# Patient Record
Sex: Male | Born: 1965
Health system: Southern US, Community
[De-identification: ages and names within clinical notes are randomized; demographics above are authoritative.]

## PROBLEM LIST (undated history)

## (undated) DIAGNOSIS — M1990 Unspecified osteoarthritis, unspecified site: Secondary | ICD-10-CM

## (undated) DIAGNOSIS — F1290 Cannabis use, unspecified, uncomplicated: Secondary | ICD-10-CM

## (undated) DIAGNOSIS — K5900 Constipation, unspecified: Secondary | ICD-10-CM

## (undated) HISTORY — PX: HAND SURGERY: SHX662

## (undated) HISTORY — DX: Cannabis use, unspecified, uncomplicated: F12.90

## (undated) HISTORY — DX: Constipation, unspecified: K59.00

## (undated) HISTORY — PX: HEMORROIDECTOMY: SUR656

---

## 1998-05-19 ENCOUNTER — Emergency Department (HOSPITAL_COMMUNITY): Admission: EM | Admit: 1998-05-19 | Discharge: 1998-05-19 | Payer: Self-pay

## 2002-03-05 ENCOUNTER — Encounter: Payer: Self-pay | Admitting: Emergency Medicine

## 2002-03-05 ENCOUNTER — Emergency Department (HOSPITAL_COMMUNITY): Admission: EM | Admit: 2002-03-05 | Discharge: 2002-03-05 | Payer: Self-pay | Admitting: Emergency Medicine

## 2007-01-29 ENCOUNTER — Emergency Department (HOSPITAL_COMMUNITY): Admission: EM | Admit: 2007-01-29 | Discharge: 2007-01-29 | Payer: Self-pay | Admitting: Emergency Medicine

## 2007-02-15 ENCOUNTER — Emergency Department (HOSPITAL_COMMUNITY): Admission: EM | Admit: 2007-02-15 | Discharge: 2007-02-15 | Payer: Self-pay | Admitting: Emergency Medicine

## 2009-01-16 ENCOUNTER — Emergency Department (HOSPITAL_COMMUNITY): Admission: EM | Admit: 2009-01-16 | Discharge: 2009-01-16 | Payer: Self-pay | Admitting: Emergency Medicine

## 2009-01-23 ENCOUNTER — Observation Stay (HOSPITAL_COMMUNITY): Admission: EM | Admit: 2009-01-23 | Discharge: 2009-01-23 | Payer: Self-pay | Admitting: Emergency Medicine

## 2009-12-16 ENCOUNTER — Emergency Department (HOSPITAL_COMMUNITY): Admission: EM | Admit: 2009-12-16 | Discharge: 2009-12-16 | Payer: Self-pay | Admitting: Emergency Medicine

## 2010-07-05 LAB — URINALYSIS, ROUTINE W REFLEX MICROSCOPIC
Bilirubin Urine: NEGATIVE
Ketones, ur: NEGATIVE mg/dL
Nitrite: NEGATIVE
Protein, ur: NEGATIVE mg/dL
Specific Gravity, Urine: 1.006 (ref 1.005–1.030)
Urobilinogen, UA: 1 mg/dL (ref 0.0–1.0)
pH: 6 (ref 5.0–8.0)

## 2010-07-05 LAB — COMPREHENSIVE METABOLIC PANEL
ALT: 16 U/L (ref 0–53)
AST: 23 U/L (ref 0–37)
Alkaline Phosphatase: 54 U/L (ref 39–117)
BUN: 8 mg/dL (ref 6–23)
CO2: 23 mEq/L (ref 19–32)
Creatinine, Ser: 1.5 mg/dL (ref 0.4–1.5)
GFR calc Af Amer: >60 mL/min (ref >60)
Glucose, Bld: 124 mg/dL — ABNORMAL HIGH (ref 70–99)
Total Bilirubin: 0.8 mg/dL (ref 0.3–1.2)
Total Protein: 7.3 g/dL (ref 6.0–8.3)

## 2010-07-05 LAB — DIFFERENTIAL
Basophils Relative: 1 % (ref 0–1)
Lymphs Abs: 2 10*3/uL (ref 0.7–4.0)
Monocytes Absolute: 0.4 10*3/uL (ref 0.1–1.0)
Neutro Abs: 8.6 10*3/uL — ABNORMAL HIGH (ref 1.7–7.7)

## 2010-07-05 LAB — URINE MICROSCOPIC-ADD ON

## 2010-07-05 LAB — CBC: MCHC: 34.9 g/dL (ref 30.0–36.0)

## 2010-07-25 LAB — BASIC METABOLIC PANEL
Calcium: 9.6 mg/dL (ref 8.4–10.5)
GFR calc Af Amer: >60 mL/min (ref >60)
GFR calc non Af Amer: 52 mL/min — ABNORMAL LOW (ref >60)
Glucose, Bld: 100 mg/dL — ABNORMAL HIGH (ref 70–99)
Sodium: 139 mEq/L (ref 135–145)

## 2010-07-25 LAB — CBC
Hemoglobin: 15.8 g/dL (ref 13.0–17.0)
RBC: 5.01 MIL/uL (ref 4.22–5.81)
RDW: 14.6 % (ref 11.5–15.5)
WBC: 3.8 10*3/uL — ABNORMAL LOW (ref 4.0–10.5)

## 2010-07-25 LAB — DIFFERENTIAL
Basophils Relative: 1 % (ref 0–1)
Eosinophils Absolute: 0.2 10*3/uL (ref 0.0–0.7)
Lymphocytes Relative: 36 % (ref 12–46)
Monocytes Relative: 10 % (ref 3–12)
Neutro Abs: 1.8 10*3/uL (ref 1.7–7.7)

## 2010-09-06 NOTE — Consult Note (Signed)
NAME:  Gregory Ray, Gregory Ray NO.:  [id number]   MEDICAL RECORD NO.:  [id number]                   PATIENT TYPE:  EMS   LOCATION:  ED                                   FACILITY:  [facility]   PHYSICIAN:  Dionne Ano. Everlene Other, M.D.         DATE OF BIRTH:  Sep 20, 1965   DATE OF CONSULTATION:  DATE OF DISCHARGE:                                   CONSULTATION   HISTORY:  In the emergency room, upon consultation with Dr. Carren Rang,  in regards to the patient's left ring finger.  The patient sustained an  injury with a fan belt-type device today while working on a car.  He  presents with a contused and lacerated finger.  He notes no other injury.  He was noted to be up-to-date on his tetanus status. I was asked to see him  and take over his care.   PAST MEDICAL HISTORY:  None.   PAST SURGICAL HISTORY:  Hemorrhoid surgery.   MEDICATIONS:  None.   ALLERGIES:  NONE.   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   PHYSICAL EXAMINATION:  HEENT:  Normal.  LUNGS:  Clear.  CARDIOVASCULAR:  Regular rate and rhythm.  EXTREMITIES:  Right upper extremity is atraumatic.  Left upper extremity is  significant for a contused and lacerated ring finer with avulsion including  portions of the nail bed and deep tissue, including bone.  This is a very  jagged wound.  I have viewed this at length.   LABORATORY DATA:  X-rays were reviewed which showed bony chip, indicative of  fracture about the distal phalanx.   IMPRESSION:  Open distal phalanx fracture with jagged lacerations about the  right ring finger in a 45 year old male.   PLAN:  I have discussed with the patient his findings, and treatment  options.  Following this, and verbal consent, the patient underwent repair  of structures.   PROCEDURE:  Patient was previously given a digital block which gave an  excellent working fashion. He then underwent a thorough prep and drape with  Betadine scrub and paint.  Once this done, the patient underwent I&D  including excisional debridement of the skin, subcutaneous tissue, bone and  nonviable nailbed tissue.  I picked out allot of particulate matter and  I&D'd this area copiously. Following the copious I&D, the patient then  underwent repair of the structures, including nailbed and soft tissue  volarly.  The patient had suturing with 6-0 chromic suture performed. This  was done to my satisfaction.  The patient tolerated this well and there were  no complications.   Once this was done, the patient then underwent sterile dressing placement.  He tolerated this well.   DISPOSITION:  I have discussed with him all issues.  He was discharged to  home on Vicodin as well as Keflex. He is to return to the office to see  [place]  in 5-7 days.  I have gone over the __________ and all questions have been  answered.                                                  Dionne Ano. Everlene Other, M.D.    Nash Mantis  D:  03/05/2002  T:  03/05/2002  Job:  [id number]

## 2011-03-01 ENCOUNTER — Emergency Department (HOSPITAL_COMMUNITY): Payer: Self-pay

## 2011-03-01 ENCOUNTER — Emergency Department (HOSPITAL_COMMUNITY)
Admission: EM | Admit: 2011-03-01 | Discharge: 2011-03-02 | Disposition: A | Discharge status: Home / Self Care | Payer: No Typology Code available for payment source | Attending: Emergency Medicine | Admitting: Emergency Medicine

## 2011-03-01 ENCOUNTER — Encounter: Payer: Self-pay | Admitting: *Deleted

## 2011-03-01 ENCOUNTER — Emergency Department (HOSPITAL_COMMUNITY): Payer: No Typology Code available for payment source

## 2011-03-01 DIAGNOSIS — S92919B Unspecified fracture of unspecified toe(s), initial encounter for open fracture: Secondary | ICD-10-CM | POA: Insufficient documentation

## 2011-03-01 DIAGNOSIS — M79609 Pain in unspecified limb: Secondary | ICD-10-CM | POA: Insufficient documentation

## 2011-03-01 DIAGNOSIS — T07XXXA Unspecified multiple injuries, initial encounter: Secondary | ICD-10-CM | POA: Insufficient documentation

## 2011-03-01 DIAGNOSIS — Y9210 Unspecified residential institution as the place of occurrence of the external cause: Secondary | ICD-10-CM | POA: Insufficient documentation

## 2011-03-01 DIAGNOSIS — R109 Unspecified abdominal pain: Secondary | ICD-10-CM | POA: Insufficient documentation

## 2011-03-01 DIAGNOSIS — IMO0002 Reserved for concepts with insufficient information to code with codable children: Secondary | ICD-10-CM | POA: Insufficient documentation

## 2011-03-01 DIAGNOSIS — F1010 Alcohol abuse, uncomplicated: Secondary | ICD-10-CM | POA: Insufficient documentation

## 2011-03-01 DIAGNOSIS — M25569 Pain in unspecified knee: Secondary | ICD-10-CM | POA: Insufficient documentation

## 2011-03-01 HISTORY — DX: Unspecified osteoarthritis, unspecified site: M19.90

## 2011-03-01 LAB — CBC
HCT: 39.2 % (ref 39.0–52.0)
MCH: 31.7 pg (ref 26.0–34.0)
RDW: 14.2 % (ref 11.5–15.5)
WBC: 9.4 10*3/uL (ref 4.0–10.5)

## 2011-03-01 LAB — COMPREHENSIVE METABOLIC PANEL
Alkaline Phosphatase: 70 U/L (ref 39–117)
CO2: 18 mEq/L — ABNORMAL LOW (ref 19–32)
Calcium: 9.2 mg/dL (ref 8.4–10.5)
GFR calc Af Amer: 76 mL/min — ABNORMAL LOW (ref >90)
GFR calc non Af Amer: 66 mL/min — ABNORMAL LOW (ref >90)
Sodium: 139 mEq/L (ref 135–145)
Total Bilirubin: 0.2 mg/dL — ABNORMAL LOW (ref 0.3–1.2)
Total Protein: 6.5 g/dL (ref 6.0–8.3)

## 2011-03-01 LAB — POCT I-STAT, CHEM 8
BUN: 15 mg/dL (ref 6–23)
Calcium, Ion: 1.11 mmol/L — ABNORMAL LOW (ref 1.12–1.32)
Creatinine, Ser: 1.5 mg/dL — ABNORMAL HIGH (ref 0.50–1.35)
Glucose, Bld: 108 mg/dL — ABNORMAL HIGH (ref 70–99)
Hemoglobin: 14.3 g/dL (ref 13.0–17.0)
TCO2: 21 mmol/L (ref 0–100)

## 2011-03-01 LAB — LACTIC ACID, PLASMA: Lactic Acid, Venous: 3.8 mmol/L — ABNORMAL HIGH (ref 0.5–2.2)

## 2011-03-01 LAB — PROTIME-INR
INR: 0.91 (ref 0.00–1.49)
Prothrombin Time: 12.4 seconds (ref 11.6–15.2)

## 2011-03-01 MED ORDER — MORPHINE SULFATE 2 MG/ML IJ SOLN
INTRAMUSCULAR | Status: AC | PRN
  Administered 2011-03-01: 4 mg via INTRAVENOUS

## 2011-03-01 MED ORDER — SODIUM CHLORIDE 0.9 % IV SOLN
Freq: Once | INTRAVENOUS | Status: AC
  Administered 2011-03-01: 1000 mL via INTRAVENOUS

## 2011-03-01 MED ORDER — IOHEXOL 300 MG/ML  SOLN
100.0000 mL | Freq: Once | INTRAMUSCULAR | Status: AC | PRN
  Administered 2011-03-01: 100 mL via INTRAVENOUS

## 2011-03-01 MED ORDER — MORPHINE SULFATE 4 MG/ML IJ SOLN
INTRAMUSCULAR | Status: AC
  Filled 2011-03-01: qty 1

## 2011-03-01 MED ORDER — MORPHINE SULFATE 4 MG/ML IJ SOLN
8.0000 mg | Freq: Once | INTRAMUSCULAR | Status: AC
  Administered 2011-03-01: 8 mg via INTRAVENOUS
  Filled 2011-03-01: qty 2
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
## 2011-03-01 NOTE — ED Notes (Signed)
Vital signs stable. 

## 2011-03-01 NOTE — ED Notes (Signed)
Family at beside. Family given emotional support. 

## 2011-03-01 NOTE — ED Notes (Signed)
Family at beside  
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
## 2011-03-02 MED ORDER — CEPHALEXIN 250 MG PO CAPS
500.0000 mg | ORAL_CAPSULE | Freq: Once | ORAL | Status: AC
  Administered 2011-03-02: 500 mg via ORAL
  Filled 2011-03-02: qty 2

## 2011-03-02 MED ORDER — CEPHALEXIN 500 MG PO CAPS: 500.0000 mg | ORAL_CAPSULE | Freq: Four times a day (QID) | ORAL | Status: AC

## 2011-03-02 MED ORDER — OXYCODONE-ACETAMINOPHEN 5-325 MG PO TABS: 2.0000 | ORAL_TABLET | ORAL | Status: AC | PRN

## 2011-03-02 MED ORDER — MORPHINE SULFATE 4 MG/ML IJ SOLN
6.0000 mg | Freq: Once | INTRAMUSCULAR | Status: AC
  Administered 2011-03-02: 6 mg via INTRAVENOUS
  Filled 2011-03-02 (×2): qty 1

## 2011-03-02 NOTE — ED Notes (Signed)
Pt wheeled out to vehicle in wheelchair. PT able to transfer from and to wheelchair on his own.

## 2011-03-02 NOTE — ED Notes (Signed)
Pt alert, no neuro deficits.

## 2011-03-28 ENCOUNTER — Other Ambulatory Visit (HOSPITAL_COMMUNITY): Payer: Self-pay | Admitting: Orthopedic Surgery

## 2011-03-28 DIAGNOSIS — M25561 Pain in right knee: Secondary | ICD-10-CM

## 2011-03-28 DIAGNOSIS — M25511 Pain in right shoulder: Secondary | ICD-10-CM

## 2011-03-30 ENCOUNTER — Other Ambulatory Visit: Payer: Self-pay | Admitting: Orthopedic Surgery

## 2011-03-30 ENCOUNTER — Ambulatory Visit (HOSPITAL_COMMUNITY)
Admission: RE | Admit: 2011-03-30 | Discharge: 2011-03-30 | Disposition: A | Discharge status: Home / Self Care | Payer: No Typology Code available for payment source | Source: Ambulatory Visit | Attending: Orthopedic Surgery | Admitting: Orthopedic Surgery

## 2011-03-30 ENCOUNTER — Ambulatory Visit (HOSPITAL_COMMUNITY): Payer: No Typology Code available for payment source

## 2011-03-30 ENCOUNTER — Other Ambulatory Visit (HOSPITAL_COMMUNITY): Payer: Self-pay | Admitting: Orthopedic Surgery

## 2011-03-30 DIAGNOSIS — M25511 Pain in right shoulder: Secondary | ICD-10-CM

## 2011-03-30 DIAGNOSIS — Z1810 Retained metal fragments, unspecified: Secondary | ICD-10-CM | POA: Insufficient documentation

## 2011-03-30 DIAGNOSIS — Z1389 Encounter for screening for other disorder: Secondary | ICD-10-CM | POA: Insufficient documentation

## 2011-03-30 DIAGNOSIS — R52 Pain, unspecified: Secondary | ICD-10-CM

## 2011-03-30 DIAGNOSIS — H02819 Retained foreign body in unspecified eye, unspecified eyelid: Secondary | ICD-10-CM | POA: Insufficient documentation

## 2011-03-30 DIAGNOSIS — Z189 Retained foreign body fragments, unspecified material: Secondary | ICD-10-CM | POA: Insufficient documentation

## 2011-04-01 ENCOUNTER — Other Ambulatory Visit (HOSPITAL_COMMUNITY): Payer: Self-pay | Admitting: Orthopedic Surgery

## 2011-04-01 DIAGNOSIS — M25561 Pain in right knee: Secondary | ICD-10-CM

## 2011-04-01 DIAGNOSIS — M75100 Unspecified rotator cuff tear or rupture of unspecified shoulder, not specified as traumatic: Secondary | ICD-10-CM

## 2011-04-01 DIAGNOSIS — S83206A Unspecified tear of unspecified meniscus, current injury, right knee, initial encounter: Secondary | ICD-10-CM

## 2011-04-01 DIAGNOSIS — M25511 Pain in right shoulder: Secondary | ICD-10-CM

## 2011-04-03 ENCOUNTER — Other Ambulatory Visit (HOSPITAL_COMMUNITY): Payer: Self-pay | Admitting: Orthopedic Surgery

## 2011-04-03 ENCOUNTER — Ambulatory Visit (HOSPITAL_COMMUNITY)
Admission: RE | Admit: 2011-04-03 | Discharge: 2011-04-03 | Disposition: A | Discharge status: Home / Self Care | Payer: No Typology Code available for payment source | Source: Ambulatory Visit | Attending: Orthopedic Surgery | Admitting: Orthopedic Surgery

## 2011-04-03 DIAGNOSIS — M25561 Pain in right knee: Secondary | ICD-10-CM

## 2011-04-03 DIAGNOSIS — M25511 Pain in right shoulder: Secondary | ICD-10-CM

## 2011-04-03 DIAGNOSIS — S83206A Unspecified tear of unspecified meniscus, current injury, right knee, initial encounter: Secondary | ICD-10-CM

## 2011-04-03 DIAGNOSIS — M75100 Unspecified rotator cuff tear or rupture of unspecified shoulder, not specified as traumatic: Secondary | ICD-10-CM

## 2011-04-03 DIAGNOSIS — M2240 Chondromalacia patellae, unspecified knee: Secondary | ICD-10-CM | POA: Insufficient documentation

## 2011-04-03 DIAGNOSIS — M25569 Pain in unspecified knee: Secondary | ICD-10-CM | POA: Insufficient documentation

## 2011-04-03 DIAGNOSIS — M25519 Pain in unspecified shoulder: Secondary | ICD-10-CM | POA: Insufficient documentation

## 2011-04-03 DIAGNOSIS — X58XXXA Exposure to other specified factors, initial encounter: Secondary | ICD-10-CM | POA: Insufficient documentation

## 2011-04-03 DIAGNOSIS — S46819A Strain of other muscles, fascia and tendons at shoulder and upper arm level, unspecified arm, initial encounter: Secondary | ICD-10-CM | POA: Insufficient documentation

## 2011-04-03 MED ORDER — IOHEXOL 180 MG/ML  SOLN
15.0000 mL | Freq: Once | INTRAMUSCULAR | Status: AC | PRN
  Administered 2011-04-03: 15 mL via INTRA_ARTICULAR

## 2011-04-03 MED ORDER — IOHEXOL 180 MG/ML  SOLN
50.0000 mL | Freq: Once | INTRAMUSCULAR | Status: AC | PRN
  Administered 2011-04-03: 50 mL via INTRA_ARTICULAR

## 2011-04-03 NOTE — Procedures (Signed)
Right shoulder arthrogram under fluoroscopy. 15 cc's Omnipaque 180 diluted 50% with lidocaine. No complications. Patient tolerated procedure well.

## 2011-04-03 NOTE — Procedures (Signed)
Right knee arthrogram under fluoroscopy. 50 cc's Omnipaque 180 diluted 50% with lidocaine. No complications. Patient tolerated procedure well.

## 2013-09-07 ENCOUNTER — Emergency Department (HOSPITAL_COMMUNITY)
Admission: EM | Admit: 2013-09-07 | Discharge: 2013-09-07 | Disposition: A | Discharge status: Home / Self Care | Payer: No Typology Code available for payment source | Attending: Emergency Medicine | Admitting: Emergency Medicine

## 2013-09-07 ENCOUNTER — Encounter (HOSPITAL_COMMUNITY): Payer: Self-pay | Admitting: Emergency Medicine

## 2013-09-07 DIAGNOSIS — G8929 Other chronic pain: Secondary | ICD-10-CM | POA: Insufficient documentation

## 2013-09-07 DIAGNOSIS — S335XXA Sprain of ligaments of lumbar spine, initial encounter: Secondary | ICD-10-CM | POA: Insufficient documentation

## 2013-09-07 DIAGNOSIS — Y929 Unspecified place or not applicable: Secondary | ICD-10-CM | POA: Insufficient documentation

## 2013-09-07 DIAGNOSIS — X503XXA Overexertion from repetitive movements, initial encounter: Secondary | ICD-10-CM | POA: Insufficient documentation

## 2013-09-07 DIAGNOSIS — F17200 Nicotine dependence, unspecified, uncomplicated: Secondary | ICD-10-CM | POA: Insufficient documentation

## 2013-09-07 DIAGNOSIS — Z8739 Personal history of other diseases of the musculoskeletal system and connective tissue: Secondary | ICD-10-CM | POA: Insufficient documentation

## 2013-09-07 DIAGNOSIS — S39012A Strain of muscle, fascia and tendon of lower back, initial encounter: Secondary | ICD-10-CM

## 2013-09-07 DIAGNOSIS — Y9389 Activity, other specified: Secondary | ICD-10-CM | POA: Insufficient documentation

## 2013-09-07 DIAGNOSIS — M25511 Pain in right shoulder: Secondary | ICD-10-CM

## 2013-09-07 DIAGNOSIS — S43429A Sprain of unspecified rotator cuff capsule, initial encounter: Secondary | ICD-10-CM | POA: Insufficient documentation

## 2013-09-07 DIAGNOSIS — S46009A Unspecified injury of muscle(s) and tendon(s) of the rotator cuff of unspecified shoulder, initial encounter: Secondary | ICD-10-CM

## 2013-09-07 MED ORDER — TRAMADOL HCL 50 MG PO TABS: 50.0000 mg | ORAL_TABLET | Freq: Four times a day (QID) | ORAL | Status: DC | PRN

## 2013-09-07 MED ORDER — CYCLOBENZAPRINE HCL 10 MG PO TABS: 10.0000 mg | ORAL_TABLET | Freq: Every day | ORAL | Status: DC

## 2013-09-07 MED ORDER — NAPROXEN 500 MG PO TABS: 500.0000 mg | ORAL_TABLET | Freq: Two times a day (BID) | ORAL | Status: DC

## 2013-09-07 NOTE — Discharge Instructions (Signed)
Call for a follow up appointment with a Family or Primary Care Provider.  Call an orthopedic specialist for further evaluation of your right shoulder pain. Return if Symptoms worsen.   Take medication as prescribed.  Ice your shoulder and lower back 3-4 times a day.  Use the resource guide listed below to help you find a primary care provider. Use your pain medication as prescribed and do not operate heavy machinery while on pain medication. Note that your pain medication contains acetaminophen (Tylenol) & its is not reccommended that you use additional acetaminophen (Tylenol) while taking this medication. Read the instructions below.    Emergency Department Resource Guide 1) Find a Doctor and Pay Out of Pocket Although you won't have to find out who is covered by your insurance plan, it is a good idea to ask around and get recommendations. You will then need to call the office and see if the doctor you have chosen will accept you as a new patient and what types of options they offer for patients who are self-pay. Some doctors offer discounts or will set up payment plans for their patients who do not have insurance, but you will need to ask so you aren't surprised when you get to your appointment.  2) Contact Your Local Health Department Not all health departments have doctors that can see patients for sick visits, but many do, so it is worth a call to see if yours does. If you don't know where your local health department is, you can check in your phone book. The CDC also has a tool to help you locate your state's health department, and many state websites also have listings of all of their local health departments.  3) Find a [facility] If your illness is not likely to be very severe or complicated, you may want to try a walk in clinic. These are popping up all over the country in pharmacies, drugstores, and shopping centers. They're usually staffed by nurse practitioners or physician  assistants that have been trained to treat common illnesses and complaints. They're usually fairly quick and inexpensive. However, if you have serious medical issues or chronic medical problems, these are probably not your best option.  No Primary Care Doctor: - Call Health Connect at  [phone] - they can help you locate a primary care doctor that  accepts your insurance, provides certain services, etc. - Physician Referral Service- [phone]  Chronic Pain Problems: Organization         Address  Phone   Notes  [facility]  [phone] Patients need to be referred by their primary care doctor.   Medication Assistance: Organization         Address  Phone   Notes  [facility] Medication [facility] [address] [place], [place] [address] [phone] --Must be a resident of [facility] -- Must have NO insurance coverage whatsoever (no Medicaid/ Medicare, etc.) -- The pt. MUST have a primary care doctor that directs their care regularly and follows them in the community   MedAssist  [phone]   [employer]  [phone]    Agencies that provide inexpensive medical care: Organization         Address  Phone   Notes  [place]  [phone]   [facility]    [phone]   [facility] [place], [place] [address] [phone]  [facility] [address], [place] [phone]   Planned Parenthood    [phone]   [facility]    [phone]   [place] and [place] [address], [place] Phone:  [phone], Fax:  [phone] Hours of Operation:  9 am - 6 pm, M-F.  Also accepts Medicaid/Medicare and self-pay.  [facility] for [address] [place], [address], [place] Phone: [phone], Fax: [phone]. Hours of Operation:  8:30 am - 5:30 pm, M-F.  Also accepts  Medicaid and self-pay.  [facility] [address] [place] Phone: [phone]   [place], [place], [place] [phone], Ext. [phone] Mondays & Thursdays: 7-9 AM.  First 15 patients are seen on a first come, first serve basis.    [place] Providers:  Organization         Address  Phone   Notes  [facility] [address], [place] [phone] Also accepts self-pay patients.  [facility] [address], [place]  [phone]   [place], [address], [place] [phone]   [facility] [address] [place] [phone]   Lucianne Lei [address], [place]   [phone] Only accepts [place] Access [place] patients after they have their name applied to their card.   Self-Pay (no insurance) in [facility]:  Organization         Address  Phone   Notes  Sickle Cell Patients, [facility] [place] [phone]   [facility] Urgent Care [place] [phone]   [facility] [place]  [place], [place], [place] [phone]   [facility]/Dr. Osei-Bonsu  [address], [place] or [address], [place] [phone] Phone number for both [place] and [place] locations is the same.  [facility] [address], [place] [phone]   [facility] [address], [place] or [address] [phone]   [facility] [address], [place] [phone], phone; [phone], fax Sees patients 1st and 3rd Saturday of every month.  Must not qualify for public or private insurance (i.e. Medicaid, Medicare, [place] Health Choice, Veterans' Benefits)  Household  income should be no more than 200% of the poverty level The clinic cannot treat you if you are pregnant or think you are pregnant  Sexually transmitted diseases are not treated at the clinic.    Dental Care: Organization         Address  Phone  Notes  [facility] [place] [phone] Accepts children up to age 73 who are enrolled in [place] or [place]; pregnant women with a Medicaid card; and children who have applied for Medicaid or [place] Health Choice, but were declined, whose parents can pay a reduced fee at time of service.  [facility] Department of [facility]  [address], [place] [phone] Accepts children up to age 87 who are enrolled in [place] or [place]; pregnant women with a Medicaid card; and  children who have applied for Medicaid or [place] Health Choice, but were declined, whose parents can pay a reduced fee at time of service.  [place] Adult Dental Access PROGRAM  [place] [phone] Patients are seen by appointment only. Walk-ins are not accepted. [place] will see patients 92 years of age and older. Monday - Tuesday (8am-5pm) Most Wednesdays (8:30-5pm) $30 per visit, cash only  [facility] Adult Dental Access PROGRAM  [address], [facility] [phone] Patients are seen by appointment only. Walk-ins are not accepted. [place] will see patients 80 years of age and older. One Wednesday Evening (Monthly: Volunteer Based).  $30 per visit, cash only  [place]  [phone] for adults; Children under age 50, call Graduate Pediatric Dentistry at [phone]. Children aged 1-14, please call [phone] to request a pediatric application.  Dental services are provided in all areas of dental care including fillings, crowns and bridges, complete and partial dentures, implants, gum  treatment, root canals, and extractions. Preventive care is also provided. Treatment is provided to both adults and children. Patients are selected via a lottery and there is often a waiting list.   [facility] [address], [place]  [phone] [email or web address]   [facility] [address] [place], [place] [phone], Ext. [phone] Second and Fourth Thursday of each month, opens at 6:30 AM; Clinic ends at 9 AM.  Patients are seen on a first-come first-served basis, and a limited number are seen during each clinic.   [facility]  [address] Hillard Danker [place], [place] [phone]   Eligibility Requirements You must have lived in [place], [place], or [place] counties for at least the last three months.   You cannot be eligible for state or federal sponsored [employer], including [employer], [place], or [employer].   You generally cannot be eligible for healthcare insurance through your employer.    How to apply: Eligibility screenings are held every Tuesday and Wednesday afternoon from 1:00 pm until 4:00 pm. You do not need an appointment for the interview!  [facility] [address], [place], [place]   [place]  [place] Department  [place]  [phone]    Behavioral Health Resources in the Community: Intensive Outpatient Programs Organization         Address  Phone  Notes  [facility] [place]. [address] [place], [place] [phone]   [facility] [address], [place], [place]   ADS: Alcohol & Drug Svcs [address], [place], [place]   [place] [address]  [place], [place] or [phone]   Substance Abuse Resources Organization         Address  Phone  Notes  Alcohol and  Drug Services  [phone]   [place]  [phone]   [place]   Chinita Pester  [phone]   Residential & Outpatient Substance Abuse Program  [phone]   Psychological Services Organization         Address  Phone  Notes  [facility] [place]  [place]  [phone]   [place] [address], [place] or [phone]    Mobile Crisis Teams Organization  Address  Phone  Notes  [facility], Mobile Crisis Care Unit  [phone]   [facility]ive Psychotherapeutic Services  [address] [place], [place]   [facility] [address] [place] [phone]    Self-Help/Support Groups Organization         Address  Phone             Notes  [place]. of [place] - variety of support groups  [place] Call for more information  Narcotics Anonymous (NA), [facility] [address], [employer] [place]  2 meetings at this location   Special educational needs teacher         Address  Phone  Notes  ASAP Residential Treatment [place],    [place]  [phone]   [facility]  [address], [place] [phone], [place], [place]   [place], [place] [phone] Admissions: 8am-3pm M-F  Incentives Substance [place] [address]    [place], [place] [phone]   [facility] [address] [place], [place], [place]   [facility] [address]  [place], [place]   [facility]ight Programs - Intensive Outpatient [address] Kristeen Mans [address], [place], [place]   [facility] ([place].,  [place], [place] [phone] or [phone]   [facility] Services (RTS) [address], [place], [place] Accepts Medicaid  Fellowship  [place] [address],  [place] [phone] Substance Abuse/Addiction Treatment   [facility] Organization         Address  Phone  Notes  [facility]  [phone]   Domenic Schwab, PhD [address] Arlis [place], [place]   [phone] or [phone]   [place], [place] [phone]   [facility] [address], [place], [place] [phone] Insurance/Medicaid/sponsorship through [facility] and Families [address] [place]                                    [place], [place] [phone] [place] [address], [place] [phone]    Dr. Adele Schilder  [phone]   [facility] Dept. 1) [address], [place] 2) [place] 3)  [address], [place] [phone]  [phone]   [place] [phone] or [phone] (After Hours)

## 2013-09-07 NOTE — ED Notes (Signed)
Per pt, states he was lifting tires and he hurt his lower back and right shoulder

## 2013-09-07 NOTE — ED Provider Notes (Signed)
CSN: [id number]     Arrival date & time 09/07/13  5631 History   First MD Initiated Contact with Patient 09/07/13 1006     Chief Complaint  Patient presents with  . Back Pain  . Shoulder Pain     (Consider location/radiation/quality/duration/timing/severity/associated sxs/prior Treatment) HPI Comments: The patient is a 48 year old male with past medical history of arthritis, chronic right shoulder pain, tobacco abuse presenting to the emergency apartment chief complaint of right shoulder pain and low back pain. The patient reports onset with back pain after lifting a heavy wheel yesterday he reports he dropped it. He reports increase in pain with movement. Denies numbness or weakness in lower extremity, urinary symptoms. The patient also reports right shoulder pain, he reports increased pain with movement. He reports decreased range of motion which has gradually gotten worse over several years. He is unsure which orthopedist she followed up with, and was told he needed surgery but did not have the resources for surgery at that time.  He reports taking Tylenol today without full resolution of symptoms. Denies history of peptic ulcer disease or history of GI bleed.  Patient is a 48 y.o. male presenting with back pain and shoulder pain. The history is provided by the patient and medical records. No language interpreter was used.  Back Pain Associated symptoms: no fever, no numbness and no weakness   Shoulder Pain Associated symptoms include arthralgias and myalgias. Pertinent negatives include no chills, fever, neck pain, numbness or weakness.    Past Medical History  Diagnosis Date  . Arthritis    History reviewed. No pertinent past surgical history. No family history on file. History  Substance Use Topics  . Smoking status: Current Every Day Smoker -- 0.25 packs/day  . Smokeless tobacco: Not on file  . Alcohol Use: Yes    Review of Systems  Constitutional: Negative for fever and  chills.  Musculoskeletal: Positive for arthralgias, back pain and myalgias. Negative for neck pain and neck stiffness.  Skin: Negative for color change.  Neurological: Negative for weakness and numbness.  All other systems reviewed and are negative.     Allergies  Review of patient's allergies indicates no known allergies.  Home Medications   Prior to Admission medications   Not on File   BP 142/88  Pulse 72  Temp(Src) 97.6 F (36.4 C) (Oral)  Resp 16  SpO2 94% Physical Exam  Nursing note and vitals reviewed. Constitutional: He is oriented to person, place, and time. He appears well-developed and well-nourished. No distress.  HENT:  Head: Normocephalic and atraumatic.  Eyes: EOM are normal. Pupils are equal, round, and reactive to light.  Neck: Neck supple.  Pulmonary/Chest: Effort normal. No respiratory distress.  Musculoskeletal:       Right shoulder: He exhibits decreased range of motion and tenderness.       Lumbar back: He exhibits tenderness.       Back:       Arms: No midline C-spine, T-spine, or L-spine tenderness with no step-offs, crepitus, or deformities noted. Paravertebral tenderness with palpation bilaterally. Good and equal strength and sensation to light touch in bilateral lower extremities. Normal gait without assistance.  Right shoulder shows a decrease in active range of motion. Good strength.  Neurological: He is alert and oriented to person, place, and time. No sensory deficit. He exhibits normal muscle tone. Gait normal.  Skin: Skin is warm and dry. No rash noted. He is not diaphoretic.  Psychiatric: He has a normal mood  and affect. His behavior is normal.    ED Course  Procedures (including critical care time)  MDM   Final diagnoses:  Chronic right shoulder pain  Rotator cuff injury  Low back strain   Patient with back pain.  No neurological deficits and normal neuro exam.  No loss of bowel or bladder control.  No concern for cauda equina.   No fever, night sweats, weight loss, h/o cancer, IVDU.  RICE protocol and pain medicine indicated and discussed with patient.  Patient with right shoulder pain and decreased range of motion. EMR shows CT right shoulder and 04/03/2011 1. Large full-thickness retracted tear of the supraspinatus tendon with secondary atrophy of the supraspinatus muscle.2. Extensive partial thickness articular surface tear of the  musculotendinous junction of the infraspinatus with slight muscle atrophy.3. 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 Complete rupture of the origin of the long head of the biceps  tendon with distal retraction.  Advise anti-inflammatory therapy, ice and and following up with an orthopedist. Discussed treatment plan with the patient. Return precautions given. Reports understanding and no other concerns at this time.  Patient is stable for discharge at this time. Meds given in ED:  Medications - No data to display  Discharge Medication List as of 09/07/2013 10:28 AM    START taking these medications   Details  cyclobenzaprine (FLEXERIL) 10 MG tablet Take 1 tablet (10 mg total) by mouth at bedtime., Starting 09/07/2013, Until Discontinued, Print    naproxen (NAPROSYN) 500 MG tablet Take 1 tablet (500 mg total) by mouth 2 (two) times daily. Take with food, Starting 09/07/2013, Until Discontinued, Print    traMADol (ULTRAM) 50 MG tablet Take 1 tablet (50 mg total) by mouth every 6 (six) hours as needed., Starting 09/07/2013, Until Discontinued, Print             Lorrine Kin, PA-C 09/07/13 1100

## 2013-09-08 NOTE — ED Provider Notes (Signed)
Medical screening examination/treatment/procedure(s) were performed by non-physician practitioner and as supervising physician I was immediately available for consultation/collaboration.   EKG Interpretation None        Orpah Greek, MD 09/08/13 [phone]

## 2013-10-13 ENCOUNTER — Emergency Department (HOSPITAL_COMMUNITY)
Admission: EM | Admit: 2013-10-13 | Discharge: 2013-10-13 | Disposition: A | Discharge status: Home / Self Care | Payer: No Typology Code available for payment source | Attending: Emergency Medicine | Admitting: Emergency Medicine

## 2013-10-13 ENCOUNTER — Emergency Department (HOSPITAL_COMMUNITY): Payer: No Typology Code available for payment source

## 2013-10-13 ENCOUNTER — Encounter (HOSPITAL_COMMUNITY): Payer: Self-pay | Admitting: Emergency Medicine

## 2013-10-13 DIAGNOSIS — Z791 Long term (current) use of non-steroidal anti-inflammatories (NSAID): Secondary | ICD-10-CM | POA: Insufficient documentation

## 2013-10-13 DIAGNOSIS — S63509A Unspecified sprain of unspecified wrist, initial encounter: Secondary | ICD-10-CM | POA: Insufficient documentation

## 2013-10-13 DIAGNOSIS — S82391A Other fracture of lower end of right tibia, initial encounter for closed fracture: Secondary | ICD-10-CM

## 2013-10-13 DIAGNOSIS — Y92410 Unspecified street and highway as the place of occurrence of the external cause: Secondary | ICD-10-CM | POA: Insufficient documentation

## 2013-10-13 DIAGNOSIS — S82899A Other fracture of unspecified lower leg, initial encounter for closed fracture: Secondary | ICD-10-CM | POA: Insufficient documentation

## 2013-10-13 DIAGNOSIS — M129 Arthropathy, unspecified: Secondary | ICD-10-CM | POA: Insufficient documentation

## 2013-10-13 DIAGNOSIS — S63502A Unspecified sprain of left wrist, initial encounter: Secondary | ICD-10-CM

## 2013-10-13 DIAGNOSIS — S82891A Other fracture of right lower leg, initial encounter for closed fracture: Secondary | ICD-10-CM

## 2013-10-13 DIAGNOSIS — Y9389 Activity, other specified: Secondary | ICD-10-CM | POA: Insufficient documentation

## 2013-10-13 DIAGNOSIS — IMO0002 Reserved for concepts with insufficient information to code with codable children: Secondary | ICD-10-CM | POA: Insufficient documentation

## 2013-10-13 DIAGNOSIS — F17200 Nicotine dependence, unspecified, uncomplicated: Secondary | ICD-10-CM | POA: Insufficient documentation

## 2013-10-13 DIAGNOSIS — T07XXXA Unspecified multiple injuries, initial encounter: Secondary | ICD-10-CM

## 2013-10-13 MED ORDER — CYCLOBENZAPRINE HCL 10 MG PO TABS: 10.0000 mg | ORAL_TABLET | Freq: Two times a day (BID) | ORAL | Status: DC | PRN

## 2013-10-13 MED ORDER — OXYCODONE-ACETAMINOPHEN 5-325 MG PO TABS
1.0000 | ORAL_TABLET | Freq: Once | ORAL | Status: AC
  Administered 2013-10-13: 1 via ORAL
  Filled 2013-10-13: qty 1

## 2013-10-13 MED ORDER — OXYCODONE-ACETAMINOPHEN 5-325 MG PO TABS: 1.0000 | ORAL_TABLET | Freq: Four times a day (QID) | ORAL | Status: DC | PRN

## 2013-10-13 NOTE — Progress Notes (Signed)
Orthopedic Tech Progress Note Patient Details:  Gregory Ray 06-13-1965 [id number]  Ortho Devices Type of Ortho Device: Crutches Ortho Device/Splint Location: lue/rle Ortho Device/Splint Interventions: Application velcro wrist splint left;posterior short leg splint right  Hildred Priest 10/13/2013, 9:33 PM
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
## 2013-10-13 NOTE — Discharge Instructions (Signed)
Ankle Fracture  A fracture is a break in a bone. The ankle joint is made up of three bones. These include the lower (distal)sections of your lower leg bones, called the tibia and fibula, along with a bone in your foot, called the talus. Depending on how bad the break is and if more than one ankle joint bone is broken, a cast or splint is used to protect and keep your injured bone from moving while it heals. Sometimes, surgery is required to help the fracture heal properly.   There are two general types of fractures:   Stable fracture. This includes a single fracture line through one bone, with no injury to ankle ligaments. A fracture of the talus that does not have any displacement (movement of the bone on either side of the fracture line) is also stable.   Unstable fracture. This includes more than one fracture line through one or more bones in the ankle joint. It also includes fractures that have displacement of the bone on either side of the fracture line.  CAUSES   A direct blow to the ankle.    Quickly and severely twisting your ankle.   Trauma, such as a car accident or falling from a significant height.  RISK FACTORS  You may be at a higher risk of ankle fracture if:   You have certain medical conditions.   You are involved in high-impact sports.   You are involved in a high-impact car accident.  SIGNS AND SYMPTOMS    Tender and swollen ankle.   Bruising around the injured ankle.   Pain on movement of the ankle.   Difficulty walking or putting weight on the ankle.   A cold foot below the site of the ankle injury. This can occur if the blood vessels passing through your injured ankle were also damaged.   Numbness in the foot below the site of the ankle injury.  DIAGNOSIS   An ankle fracture is usually diagnosed with a physical exam and X-rays. A CT scan may also be required for complex fractures.  TREATMENT   Stable fractures are treated with a cast or splint and using crutches to avoid putting  weight on your injured ankle. This is followed by an ankle strengthening program. Some patients require a special type of cast, depending on other medical problems they may have. Unstable fractures require surgery to ensure the bones heal properly. Your health care provider will tell you what type of fracture you have and the best treatment for your condition.  HOME CARE INSTRUCTIONS    Review correct crutch use with your health care provider and use your crutches as directed. Safe use of crutches is extremely important. Misuse of crutches can cause you to fall or cause injury to nerves in your hands or armpits.   Do not put weight or pressure on the injured ankle until directed by your health care provider.   To lessen the swelling, keep the injured leg elevated while sitting or lying down.   Apply ice to the injured area:   Put ice in a plastic bag.   Place a towel between your cast and the bag.   Leave the ice on for 20 minutes, 2-3 times a day.   If you have a plaster or fiberglass cast:   Do not try to scratch the skin under the cast with any objects. This can increase your risk of skin infection.   Check the skin around the cast every day. You   may put lotion on any red or sore areas.   Keep your cast dry and clean.   If you have a plaster splint:   Wear the splint as directed.   You may loosen the elastic around the splint if your toes become numb, tingle, or turn cold or blue.   Do not put pressure on any part of your cast or splint; it may break. Rest your cast only on a pillow the first 24 hours until it is fully hardened.   Your cast or splint can be protected during bathing with a plastic bag sealed to your skin with medical tape. Do not lower the cast or splint into water.   Take medicines as directed by your health care provider. Only take over-the-counter or prescription medicines for pain, discomfort, or fever as directed by your health care provider.   Do not drive a vehicle until  your health care provider specifically tells you it is safe to do so.   If your health care provider has given you a follow-up appointment, it is very important to keep that appointment. Not keeping the appointment could result in a chronic or permanent injury, pain, and disability. If you have any problem keeping the appointment, call the facility for assistance.  SEEK MEDICAL CARE IF:  You develop increased swelling or discomfort.  SEEK IMMEDIATE MEDICAL CARE IF:    Your cast gets damaged or breaks.   You have continued severe pain.   You develop new pain or swelling after the cast was put on.   Your skin or toenails below the injury turn blue or gray.   Your skin or toenails below the injury feel cold, numb, or have loss of sensitivity to touch.   There is a bad smell or pus draining from under the cast.  MAKE SURE YOU:    Understand these instructions.   Will watch your condition.   Will get help right away if you are not doing well or get worse.  Document Released: 04/04/2000 Document Revised: 04/12/2013 Document Reviewed: 11/04/2012  [employer] Patient Information 2015 [employer]. This information is not intended to replace advice given to you by your health care provider. Make sure you discuss any questions you have with your health care provider.

## 2013-10-13 NOTE — ED Notes (Signed)
Pt to xray at this time.

## 2016-07-06 ENCOUNTER — Emergency Department (HOSPITAL_COMMUNITY)
Admission: EM | Admit: 2016-07-06 | Discharge: 2016-07-06 | Disposition: A | Discharge status: Home / Self Care | Payer: No Typology Code available for payment source | Attending: Emergency Medicine | Admitting: Emergency Medicine

## 2016-07-06 ENCOUNTER — Emergency Department (HOSPITAL_COMMUNITY): Payer: No Typology Code available for payment source

## 2016-07-06 ENCOUNTER — Encounter (HOSPITAL_COMMUNITY): Payer: Self-pay | Admitting: Emergency Medicine

## 2016-07-06 DIAGNOSIS — Y9389 Activity, other specified: Secondary | ICD-10-CM | POA: Insufficient documentation

## 2016-07-06 DIAGNOSIS — Y999 Unspecified external cause status: Secondary | ICD-10-CM | POA: Insufficient documentation

## 2016-07-06 DIAGNOSIS — M25532 Pain in left wrist: Secondary | ICD-10-CM | POA: Insufficient documentation

## 2016-07-06 DIAGNOSIS — X58XXXA Exposure to other specified factors, initial encounter: Secondary | ICD-10-CM | POA: Insufficient documentation

## 2016-07-06 DIAGNOSIS — F17200 Nicotine dependence, unspecified, uncomplicated: Secondary | ICD-10-CM | POA: Insufficient documentation

## 2016-07-06 DIAGNOSIS — Y92007 Garden or yard of unspecified non-institutional (private) residence as the place of occurrence of the external cause: Secondary | ICD-10-CM | POA: Insufficient documentation

## 2016-07-06 MED ORDER — TRAMADOL HCL 50 MG PO TABS: 50.0000 mg | ORAL_TABLET | Freq: Four times a day (QID) | ORAL | 0 refills | Status: DC | PRN

## 2016-07-06 NOTE — ED Triage Notes (Signed)
Pt doing yard work yesterday, felt a pop in L wrist at [place] last evening, pain worsened this morning and has sharp shooting pain in wrist that radiates into hand. L hand warm, color appropriate.

## 2016-07-06 NOTE — ED Provider Notes (Signed)
[facility] DEPT Provider Note   CSN: [id number] Arrival date & time: 07/06/16  2637  By signing my name below, I, Gregory Ray, attest that this documentation has been prepared under the direction and in the presence of [facility], PA-C.  Electronically Signed: Reola Ray, ED Scribe. 07/06/16. 10:47 AM.  History   Chief Complaint Chief Complaint  Patient presents with  . Wrist Pain   The history is provided by the patient. No language interpreter was used.   HPI Comments: Gregory Ray is a right-hand dominant 51 y.o. male with no pertinent PMHx who presents to the Emergency Department complaining of sudden onset, persistent left wrist pain beginning last night around 8PM. He describes his pain as sharp. Per pt, he was working in his yard last night moving an object when he heard a sudden "pop" to the radial aspect of the left wrist and his pain has been present since. No trauma or injury to the left wrist otherwise. His pain is worse with ROM of the wrist. Pt has been applying ice to the area and taking OTC analgesics without significant relief of his pain. He has previously fractured the left wrist in the distant past and this feels similar. Pt denies numbness, weakness, or any other associated symptoms.   Past Medical History:  Diagnosis Date  . Arthritis    There are no active problems to display for this patient.  History reviewed. No pertinent surgical history.  Home Medications    Prior to Admission medications   Medication Sig Start Date End Date Taking? Authorizing Provider  acetaminophen (TYLENOL) 325 MG tablet Take 650 mg by mouth every 6 (six) hours as needed for mild pain.    Historical Provider, MD  cyclobenzaprine (FLEXERIL) 10 MG tablet Take 1 tablet (10 mg total) by mouth at bedtime. 09/07/13   Harvie Heck, PA-C  cyclobenzaprine (FLEXERIL) 10 MG tablet Take 1 tablet (10 mg total) by mouth 2 (two) times daily as needed for muscle spasms.  10/13/13   Blanchie Dessert, MD  Dextromethorphan HBr (VICKS DAYQUIL COUGH PO) Take 5 mLs by mouth daily as needed (for cold).    Historical Provider, MD  guaiFENesin (MUCINEX) 600 MG 12 hr tablet Take 600 mg by mouth daily.    Historical Provider, MD  naproxen (NAPROSYN) 500 MG tablet Take 1 tablet (500 mg total) by mouth 2 (two) times daily. Take with food 09/07/13   Harvie Heck, PA-C  oxyCODONE-acetaminophen (PERCOCET/ROXICET) 5-325 MG per tablet Take 1-2 tablets by mouth every 6 (six) hours as needed for severe pain. 10/13/13   Blanchie Dessert, MD  traMADol (ULTRAM) 50 MG tablet Take 1 tablet (50 mg total) by mouth every 6 (six) hours as needed. 09/07/13   Harvie Heck, PA-C   Family History History reviewed. No pertinent family history.  Social History Social History  Substance Use Topics  . Smoking status: Current Every Day Smoker    Packs/day: 0.25  . Smokeless tobacco: Never Used  . Alcohol use No   Allergies   Ibuprofen and No known allergies  Review of Systems Review of Systems  Musculoskeletal: Positive for arthralgias and myalgias.  Neurological: Negative for weakness and numbness.   Physical Exam Updated Vital Signs BP 127/86 (BP Location: Right Arm)   Pulse 76   Temp 97.6 F (36.4 C) (Oral)   Resp 20   SpO2 93%   Physical Exam  Constitutional: He appears well-developed and well-nourished. No distress.  HENT:  Head: Normocephalic and  atraumatic.  Neck: Neck supple.  Cardiovascular: Normal rate, regular rhythm and normal heart sounds.   No murmur heard. Pulmonary/Chest: Effort normal and breath sounds normal. No respiratory distress. He has no wheezes. He has no rales.  Musculoskeletal: Normal range of motion. He exhibits tenderness.  Left hand/wrist with no gross deformity noted. Patient has full ROM and good grip strength.There is tenderness to palpation over the dorsum of the left wrist and anatomical snuff box. + swelling. The patient has normal sensation  and motor function in the median, ulnar, and radial nerve distributions. The patient has normal active and passive range of motion of their digits. 2+ radial pulse.  Neurological: He is alert.  Skin: Skin is warm and dry.  Nursing note and vitals reviewed.  ED Treatments / Results  DIAGNOSTIC STUDIES: Oxygen Saturation is 93% on RA, adequate by my interpretation.   COORDINATION OF CARE: 10:43 AM-Discussed next steps with pt. Pt verbalized understanding and is agreeable with the plan.   Radiology Dg Wrist Complete Left  Result Date: 07/06/2016 CLINICAL DATA:  Left wrist injury yesterday with lateral pain. 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 Evidence of scapholunate advanced collapse with mild degenerative change of the radiocarpal joint and carpal bones. No acute fracture or dislocation. IMPRESSION: No acute findings. 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 Old triquetrum bone fracture. Degenerative changes of the radiocarpal joint and carpal bones. Electronically Signed   By: Marin Olp M.D.   On: 07/06/2016 10:27   Procedures Procedures   SPLINT APPLICATION Authorized by: York Cerise Adalia Pettis PA-C Consent: Verbal consent obtained. Risks and benefits: risks, benefits and alternatives were discussed Consent given by: patient Splint applied by: orthopedic technician Location details: left wrist Splint type: Velcro thumb spica Post-procedure: The splinted body part was neurovascularly unchanged following the procedure. Patient tolerance: Patient tolerated the procedure well with no immediate complications.  Medications Ordered in ED Medications - No data to display  Initial Impression / Assessment and Plan / ED Course  I have  reviewed the triage vital signs and the nursing notes.  Pertinent imaging results that were available during my care of the patient were reviewed by me and considered in my medical decision making (see chart for details).     Gregory Ray is a 51yo male presents to the ED for evaluation of acute onset of left wrist pain beginning last night while working in his yard. No significant traumatic injury to the wrist. 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 Old triquetrum bone fracture. Degenerative changes of the radiocarpal joint and carpal bones. Patient notes prior injury to the hand and states this feels similar. He is tender to palpation over the snuff box. Will place in velcro thumb spica and have patient follow up with ortho in one week - referral given. Patient will be sent home with very short course of Tramadol for severe pain. Tylenol PRN for mild to moderate pain. Conservative therapies discussed and recommended. Patient appears stable for discharge at this time. Return precautions discussed and outlined in discharge paperwork. Patient is agreeable to plan.   Final Clinical Impressions(s) / ED Diagnoses   Final diagnoses:  None  New Prescriptions New Prescriptions   No medications on file   I personally performed the services described in this documentation, which was scribed in my presence. The recorded information has been reviewed and is accurate.     [facility] Mckinnley Smithey, PA-C 07/06/16 Victor, MD 07/07/16 [phone]

## 2016-07-06 NOTE — ED Notes (Signed)
Bed: WTR6 Expected date:  Expected time:  Means of arrival:  Comments: 

## 2016-07-06 NOTE — Discharge Instructions (Signed)
It was my pleasure taking care of you today!   Tylenol as needed for mild to moderate pain. Tramadol only as needed for severe pain - This can make you very drowsy - please do not drink alcohol, operate heavy machinery or drive on this medication. Ice affected area for additional pain relief.   Please call the orthopedist listed tomorrow morning to schedule your follow up appointment.   Please return to the ER for new or worsening symptoms, any additional concerns.   COLD THERAPY DIRECTIONS:  Ice or gel packs can be used to reduce both pain and swelling. Ice is the most helpful within the first 24 to 48 hours after an injury or flareup from overusing a muscle or joint.  Ice is effective, has very few side effects, and is safe for most people to use.   If you expose your skin to cold temperatures for too long or without the proper protection, you can damage your skin or nerves. Watch for signs of skin damage due to cold.   HOME CARE INSTRUCTIONS  Follow these tips to use ice and cold packs safely.  Place a dry or damp towel between the ice and skin. A damp towel will cool the skin more quickly, so you may need to shorten the time that the ice is used.  For a more rapid response, add gentle compression to the ice.  Ice for no more than 10 to 20 minutes at a time. The bonier the area you are icing, the less time it will take to get the benefits of ice.  Check your skin after 5 minutes to make sure there are no signs of a poor response to cold or skin damage.  Rest 20 minutes or more in between uses.  Once your skin is numb, you can end your treatment. You can test numbness by very lightly touching your skin. The touch should be so light that you do not see the skin dimple from the pressure of your fingertip. When using ice, most people will feel these normal sensations in this order: cold, burning, aching, and numbness.

## 2016-07-06 NOTE — ED Notes (Signed)
Bed: WTR7 Expected date:  Expected time:  Means of arrival:  Comments: 

## 2016-10-10 ENCOUNTER — Ambulatory Visit (INDEPENDENT_AMBULATORY_CARE_PROVIDER_SITE_OTHER): Payer: Self-pay | Admitting: Physician Assistant

## 2016-10-10 ENCOUNTER — Encounter (INDEPENDENT_AMBULATORY_CARE_PROVIDER_SITE_OTHER): Payer: Self-pay | Admitting: Physician Assistant

## 2016-10-10 VITALS — BP 121/79 | HR 77 | Temp 98.6°F | Resp 18 | Ht 62.0 in | Wt 234.0 lb

## 2016-10-10 DIAGNOSIS — M25512 Pain in left shoulder: Secondary | ICD-10-CM

## 2016-10-10 DIAGNOSIS — M25532 Pain in left wrist: Secondary | ICD-10-CM

## 2016-10-10 DIAGNOSIS — G8929 Other chronic pain: Secondary | ICD-10-CM

## 2016-10-10 MED ORDER — TRAMADOL HCL 50 MG PO TABS: 50.0000 mg | ORAL_TABLET | Freq: Four times a day (QID) | ORAL | 0 refills | Status: DC | PRN

## 2016-10-10 NOTE — Progress Notes (Signed)
Subjective:  Patient ID: Gregory Ray, male    DOB: 02/05/1966  Age: 51 y.o. MRN: [id number]  CC:  Establish care HPI Gregory Ray is a RHD 51 y.o. male with a PMH of left wrist scaphoid fracture and left shoulder pain presents to address these issues. Had an ED visit on 07/06/16 for left wrist pain. 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 Old triquetrum bone fracture. Degenerative changes of the radiocarpal joint and carpal bones.    Left shoulder imaged in 2015 with no evidence of arthropathy or other focal abnormalities. Continues with pain. Has difficulty abducting and flexing shoulder. Feels weakness to the shoulder and there is also a "grinding" sound with movement of the shoulder. Does not endorse neuropathic symptoms. Denies CP, palpitations, SOB, HA, abdominal pain, f/c/n/v, rash, or GI/GU sxs.    ROS Review of Systems  Constitutional: Negative for chills, fever and malaise/fatigue.  Eyes: Negative for blurred vision.  Respiratory: Negative for shortness of breath.   Cardiovascular: Negative for chest pain and palpitations.  Gastrointestinal: Negative for abdominal pain and nausea.  Genitourinary: Negative for dysuria and hematuria.  Musculoskeletal: Positive for joint pain. Negative for myalgias.  Skin: Negative for rash.  Neurological: Negative for tingling and headaches.  Psychiatric/Behavioral: Negative for depression. The patient is not nervous/anxious.     Objective:  BP 121/79 (BP Location: Left Arm, Patient Position: Sitting, Cuff Size: Normal)   Pulse 77   Temp 98.6 F (37 C) (Oral)   Resp 18   Ht 5\' 2"  (1.575 m)   Wt 234 lb (106.1 kg)   SpO2 96%   BMI 42.80 kg/m   BP/Weight 10/10/2016 07/06/2016 0/12/3816  Systolic BP 299 371 696  Diastolic BP 79 86 65  Wt. (Lbs) 234 - -  BMI 42.8 - -       Physical Exam  Constitutional: He is oriented to person, place, and time.  Well developed, obese, NAD, polite  HENT:  Head: Normocephalic and atraumatic.  Eyes: No scleral icterus.  Neck: Normal range of motion.  Cardiovascular: Normal rate, regular rhythm and normal heart sounds.   Pulmonary/Chest: Effort normal and breath sounds normal.  Musculoskeletal: He exhibits no edema.  Left shoulder with crepitus. Left shoulder with limited abduction and flexion at approximately 60 degrees. Drop arm test positive. All other provacative testing elicited pain. Mild atrophy of the left thenar prominence compared to the right thenar prominence.  Neurological: He is alert and oriented to person, place, and time. No cranial nerve deficit. Coordination normal.  Hand grip strength 5/5 bilaterally, light touch sensation mildly impaired on LUE.  Skin: Skin is warm and dry. No rash noted. No erythema. No pallor.  Psychiatric: He has a normal mood and affect. His behavior is normal. Thought content normal.  Vitals reviewed.    Assessment & Plan:   1. Chronic left shoulder pain - Suspected rotator cuff tear  - Advised to complete orange card and [employer] applications for financial assistance before I place orthopedic referral. Pt in agreement. - Begin Tramadol 50 mg q6hrs PRN #40  2. Left wrist pain, chronic - Remote scaphoid wrist fracture never surgically repaired due to lack of finances and insurance.  - Advised to complete orange card and [employer] applications for financial assistance before I place orthopedic referral. Pt in agreement.  Follow-up: 4 weeks  Clent Demark PA

## 2016-10-10 NOTE — Patient Instructions (Signed)

## 2016-10-13 ENCOUNTER — Ambulatory Visit (HOSPITAL_COMMUNITY)
Admission: RE | Admit: 2016-10-13 | Discharge: 2016-10-13 | Disposition: A | Discharge status: Home / Self Care | Payer: No Typology Code available for payment source | Source: Ambulatory Visit | Attending: Physician Assistant | Admitting: Physician Assistant

## 2016-10-13 DIAGNOSIS — G8929 Other chronic pain: Secondary | ICD-10-CM | POA: Insufficient documentation

## 2016-10-13 DIAGNOSIS — M19012 Primary osteoarthritis, left shoulder: Secondary | ICD-10-CM | POA: Insufficient documentation

## 2016-10-13 DIAGNOSIS — M25512 Pain in left shoulder: Secondary | ICD-10-CM | POA: Insufficient documentation

## 2016-10-13 MED FILL — traMADol HCL 50 MG TABS: 50 | 20 days supply | Qty: 80 | Fill #0

## 2016-11-03 ENCOUNTER — Other Ambulatory Visit (INDEPENDENT_AMBULATORY_CARE_PROVIDER_SITE_OTHER): Payer: Self-pay | Admitting: Physician Assistant

## 2016-11-03 DIAGNOSIS — Z202 Contact with and (suspected) exposure to infections with a predominantly sexual mode of transmission: Secondary | ICD-10-CM

## 2016-11-03 MED ORDER — METRONIDAZOLE 500 MG PO TABS: 500.0000 mg | ORAL_TABLET | Freq: Two times a day (BID) | ORAL | 0 refills | Status: AC

## 2016-11-03 MED ORDER — METRONIDAZOLE 500 MG PO TABS: 500.0000 mg | ORAL_TABLET | Freq: Two times a day (BID) | ORAL | 0 refills | Status: DC

## 2016-11-03 MED FILL — metroNIDAZOLE 500 MG TABS: 500 | 7 days supply | Qty: 14 | Fill #0

## 2016-11-11 ENCOUNTER — Ambulatory Visit (INDEPENDENT_AMBULATORY_CARE_PROVIDER_SITE_OTHER): Payer: Self-pay | Admitting: Physician Assistant

## 2016-11-11 ENCOUNTER — Encounter (INDEPENDENT_AMBULATORY_CARE_PROVIDER_SITE_OTHER): Payer: Self-pay | Admitting: Physician Assistant

## 2016-11-11 VITALS — BP 130/80 | HR 62 | Temp 98.1°F | Wt 240.2 lb

## 2016-11-11 DIAGNOSIS — Z1211 Encounter for screening for malignant neoplasm of colon: Secondary | ICD-10-CM

## 2016-11-11 DIAGNOSIS — R1032 Left lower quadrant pain: Secondary | ICD-10-CM

## 2016-11-11 DIAGNOSIS — K641 Second degree hemorrhoids: Secondary | ICD-10-CM

## 2016-11-11 DIAGNOSIS — K5900 Constipation, unspecified: Secondary | ICD-10-CM

## 2016-11-11 MED ORDER — SENNOSIDES-DOCUSATE SODIUM 8.6-50 MG PO TABS: 1.0000 | ORAL_TABLET | Freq: Two times a day (BID) | ORAL | 0 refills | Status: DC

## 2016-11-11 MED ORDER — POLYETHYLENE GLYCOL 3350 17 GM/SCOOP PO POWD: 17.0000 g | Freq: Every day | ORAL | 1 refills | Status: DC

## 2016-11-11 MED FILL — POLYETHYLENE GLYCOL 3350 PO: 15 days supply | Qty: 255 | Fill #0

## 2016-11-11 NOTE — Progress Notes (Signed)
Pt now complains of pains on his left side, states when he goes to yawn or stretch it feels as though he is going to cramp up.

## 2016-11-11 NOTE — Patient Instructions (Signed)

## 2016-11-11 NOTE — Progress Notes (Signed)
Subjective:  Patient ID: Gregory Ray, male    DOB: 10-24-1965  Age: 51 y.o. MRN: [id number]  CC: abdominal pain  HPI Gregory Ray is a 51 y.o. male with a PMH of left scaphoid fracture presents with LLQ pain since one week ago. Pain is present when he yawns, stretches, or picks something up. Feels like a cramp in the LLQ. Endorses constipation and hematochezia. Noted blood in the back of the commode "like a spray" this morning. Has a previous history of hemorrhoids. Does not endorse f/c/n/v, abdominal bloating, recent hospital visit, recent antibiotic use, CP, palpitations, SOB, HA, swelling, rash, or GU sxs.     Outpatient Medications Prior to Visit  Medication Sig Dispense Refill  . traMADol (ULTRAM) 50 MG tablet Take 1 tablet (50 mg total) by mouth every 6 (six) hours as needed. 80 tablet 0  . acetaminophen (TYLENOL) 325 MG tablet Take 650 mg by mouth every 6 (six) hours as needed for mild pain.    . cyclobenzaprine (FLEXERIL) 10 MG tablet Take 1 tablet (10 mg total) by mouth 2 (two) times daily as needed for muscle spasms. (Patient not taking: Reported on 11/11/2016) 20 tablet 0   No facility-administered medications prior to visit.      ROS Review of Systems  Constitutional: Negative for chills, fever and malaise/fatigue.  Eyes: Negative for blurred vision.  Respiratory: Negative for shortness of breath.   Cardiovascular: Negative for chest pain and palpitations.  Gastrointestinal: Positive for abdominal pain. Negative for nausea.  Genitourinary: Negative for dysuria and hematuria.  Musculoskeletal: Negative for joint pain and myalgias.  Skin: Negative for rash.  Neurological: Negative for tingling and headaches.  Psychiatric/Behavioral: Negative for depression. The patient is not nervous/anxious.     Objective:  BP 130/80 (BP Location: Left Arm, Patient Position: Sitting, Cuff Size: Large)   Pulse 62   Temp 98.1 F (36.7 C) (Oral)   Wt 240 lb 3.2 oz (109 kg)    SpO2 94%   BMI 43.93 kg/m   BP/Weight 11/11/2016 10/10/2016 6/50/3546  Systolic BP 568 127 517  Diastolic BP 80 79 86  Wt. (Lbs) 240.2 234 -  BMI 43.93 42.8 -      Physical Exam  Constitutional: He is oriented to person, place, and time.  Well developed, obese, NAD, polite  HENT:  Head: Normocephalic and atraumatic.  Eyes: No scleral icterus.  Neck: Normal range of motion. Neck supple. No thyromegaly present.  Cardiovascular: Normal rate, regular rhythm and normal heart sounds.   Pulmonary/Chest: Effort normal and breath sounds normal.  Abdominal: Soft. He exhibits no distension and no mass. There is tenderness (LUQ and LLQ). There is no guarding.  Hypoactive bowel sounds  Genitourinary:  Genitourinary Comments: Grade 2 hemorrhoid,non-thrombosed  Musculoskeletal: He exhibits no edema.  Neurological: He is alert and oriented to person, place, and time. No cranial nerve deficit. Coordination normal.  Skin: Skin is warm and dry. No rash noted. No erythema. No pallor.  Psychiatric: He has a normal mood and affect. His behavior is normal. Thought content normal.  Vitals reviewed.    Assessment & Plan:   1. Constipation, unspecified constipation type - Begin senna-docusate (SENOKOT-S) 8.6-50 MG tablet; Take 1 tablet by mouth 2 (two) times daily.  Dispense: 14 tablet; Refill: 0 - Begin polyethylene glycol powder (GLYCOLAX/MIRALAX) powder; Take 17 g by mouth daily.  Dispense: 3350 g; Refill: 1  2. LLQ pain - Begin senna-docusate (SENOKOT-S) 8.6-50 MG tablet; Take 1 tablet by mouth 2 (  two) times daily.  Dispense: 14 tablet; Refill: 0 - Begin polyethylene glycol powder (GLYCOLAX/MIRALAX) powder; Take 17 g by mouth daily.  Dispense: 3350 g; Refill: 1  3. Grade II hemorrhoids - Begin senna-docusate (SENOKOT-S) 8.6-50 MG tablet; Take 1 tablet by mouth 2 (two) times daily.  Dispense: 14 tablet; Refill: 0 - Begin polyethylene glycol powder (GLYCOLAX/MIRALAX) powder; Take 17 g by mouth  daily.  Dispense: 3350 g; Refill: 1  4. Special screening for malignant neoplasms, colon - Ambulatory referral to Gastroenterology - Expected to have approval of orange card and [employer] soon.  Meds ordered this encounter  Medications  . senna-docusate (SENOKOT-S) 8.6-50 MG tablet    Sig: Take 1 tablet by mouth 2 (two) times daily.    Dispense:  14 tablet    Refill:  0    Order Specific Question:   Supervising Provider    Answer:   Tresa Garter [id number]  . polyethylene glycol powder (GLYCOLAX/MIRALAX) powder    Sig: Take 17 g by mouth daily.    Dispense:  3350 g    Refill:  1    Order Specific Question:   Supervising Provider    Answer:   Tresa Garter [[id number]]    Follow-up: Return in about 2 months (around 01/12/2017) for full physical.   Clent Demark PA

## 2016-11-17 ENCOUNTER — Telehealth (INDEPENDENT_AMBULATORY_CARE_PROVIDER_SITE_OTHER): Payer: Self-pay | Admitting: Physician Assistant

## 2016-11-17 NOTE — Telephone Encounter (Signed)
Sent to PCP. Tempestt S Roberts, CMA  

## 2016-11-17 NOTE — Telephone Encounter (Signed)
Patient called stated [facility] did not receive rx for trich.  Please follow up with patient.

## 2016-11-18 ENCOUNTER — Other Ambulatory Visit (INDEPENDENT_AMBULATORY_CARE_PROVIDER_SITE_OTHER): Payer: Self-pay | Admitting: Physician Assistant

## 2016-11-18 DIAGNOSIS — Z202 Contact with and (suspected) exposure to infections with a predominantly sexual mode of transmission: Secondary | ICD-10-CM

## 2016-11-18 MED ORDER — METRONIDAZOLE 500 MG PO TABS: 500.0000 mg | ORAL_TABLET | Freq: Two times a day (BID) | ORAL | 0 refills | Status: AC

## 2016-11-18 MED FILL — metroNIDAZOLE 500 MG TABS: 500 | 7 days supply | Qty: 14 | Fill #0

## 2016-11-18 NOTE — Telephone Encounter (Signed)
Please call and tell patient the medication should be there now.

## 2016-11-18 NOTE — Telephone Encounter (Signed)
Patient aware. Gregory Ray, CMA  

## 2016-11-19 ENCOUNTER — Ambulatory Visit (INDEPENDENT_AMBULATORY_CARE_PROVIDER_SITE_OTHER): Payer: Self-pay | Admitting: Physician Assistant

## 2016-11-19 ENCOUNTER — Encounter (INDEPENDENT_AMBULATORY_CARE_PROVIDER_SITE_OTHER): Payer: Self-pay | Admitting: Physician Assistant

## 2016-11-19 VITALS — BP 128/82 | HR 67 | Temp 98.0°F | Ht 65.0 in | Wt 234.2 lb

## 2016-11-19 DIAGNOSIS — Z114 Encounter for screening for human immunodeficiency virus [HIV]: Secondary | ICD-10-CM

## 2016-11-19 DIAGNOSIS — K5900 Constipation, unspecified: Secondary | ICD-10-CM

## 2016-11-19 DIAGNOSIS — Z131 Encounter for screening for diabetes mellitus: Secondary | ICD-10-CM

## 2016-11-19 DIAGNOSIS — Z1211 Encounter for screening for malignant neoplasm of colon: Secondary | ICD-10-CM

## 2016-11-19 DIAGNOSIS — Z23 Encounter for immunization: Secondary | ICD-10-CM

## 2016-11-19 LAB — POCT GLYCOSYLATED HEMOGLOBIN (HGB A1C): Hemoglobin A1C: 5.7

## 2016-11-19 NOTE — Patient Instructions (Signed)

## 2016-11-19 NOTE — Progress Notes (Signed)
Subjective:  Patient ID: Gregory Ray, male    DOB: 11/18/1965  Age: 51 y.o. MRN: [id number]  CC: constipation  HPI Gregory Ray is a 51 y.o. male with a PMH of trichomoniasis and constipation presents to follow up on constipation. Was prescribed docusate senna BID and Miralax qday on 11/11/16. Says his constipation remained the same. Still has discomfort in the LLQ. Takes 2-3 days to have a BM. Stool is usually small and rounded. Feels like he still has more stool but none is evacuated. Had blood in stool previously but was attributed to a hemorrhoid. Does not endorse any other symptoms or complaints.      Outpatient Medications Prior to Visit  Medication Sig Dispense Refill  . metroNIDAZOLE (FLAGYL) 500 MG tablet Take 1 tablet (500 mg total) by mouth 2 (two) times daily. 14 tablet 0  . polyethylene glycol powder (GLYCOLAX/MIRALAX) powder Take 17 g by mouth daily. 3350 g 1  . senna-docusate (SENOKOT-S) 8.6-50 MG tablet Take 1 tablet by mouth 2 (two) times daily. 14 tablet 0  . traMADol (ULTRAM) 50 MG tablet Take 1 tablet (50 mg total) by mouth every 6 (six) hours as needed. 80 tablet 0  . acetaminophen (TYLENOL) 325 MG tablet Take 650 mg by mouth every 6 (six) hours as needed for mild pain.    . cyclobenzaprine (FLEXERIL) 10 MG tablet Take 1 tablet (10 mg total) by mouth 2 (two) times daily as needed for muscle spasms. (Patient not taking: Reported on 11/11/2016) 20 tablet 0   No facility-administered medications prior to visit.      ROS Review of Systems  Constitutional: Negative for chills, fever and malaise/fatigue.  Eyes: Negative for blurred vision.  Respiratory: Negative for shortness of breath.   Cardiovascular: Negative for chest pain and palpitations.  Gastrointestinal: Positive for constipation. Negative for abdominal pain and nausea.  Genitourinary: Negative for dysuria and hematuria.  Musculoskeletal: Negative for joint pain and myalgias.  Skin: Negative for rash.   Neurological: Negative for tingling and headaches.  Psychiatric/Behavioral: Negative for depression. The patient is not nervous/anxious.     Objective:  BP 128/82 (BP Location: Right Arm, Patient Position: Sitting, Cuff Size: Large)   Pulse 67   Temp 98 F (36.7 C) (Oral)   Ht 5\' 5"  (1.651 m)   Wt 234 lb 3.2 oz (106.2 kg)   SpO2 93%   BMI 38.97 kg/m   BP/Weight 11/19/2016 11/11/2016 2/95/6213  Systolic BP 086 578 469  Diastolic BP 82 80 79  Wt. (Lbs) 234.2 240.2 234  BMI 38.97 43.93 42.8      Physical Exam  Constitutional: He is oriented to person, place, and time.  Well developed, obese, NAD, polite  HENT:  Head: Normocephalic and atraumatic.  Eyes: No scleral icterus.  Neck: Normal range of motion. Neck supple. No thyromegaly present.  Cardiovascular: Normal rate, regular rhythm and normal heart sounds.   Pulmonary/Chest: Effort normal and breath sounds normal.  Abdominal: Soft. Bowel sounds are normal. He exhibits no distension. There is no tenderness.  Hypoactive bowel sounds. Obese abdomen, could not feel for hepatosplenomegaly.  Musculoskeletal: He exhibits no edema.  Neurological: He is alert and oriented to person, place, and time. No cranial nerve deficit. Coordination normal.  Skin: Skin is warm and dry. No rash noted. No erythema. No pallor.  Psychiatric: He has a normal mood and affect. His behavior is normal. Thought content normal.  Vitals reviewed.    Assessment & Plan:   1. Constipation,  unspecified constipation type - CBC with Differential - Comprehensive metabolic panel - TSH  2. Screening for colon cancer - Ambulatory referral to Gastroenterology for colonoscopy  3. Screening for diabetes mellitus - HgB A1c 5.7% in clinic today.  4. Encounter for screening for HIV - HIV antibody  5. Need for Tdap vaccination - Tdap vaccine greater than or equal to 7yo IM     Follow-up: Return in about 4 weeks (around 12/17/2016).   Clent Demark  PA

## 2016-11-20 LAB — COMPREHENSIVE METABOLIC PANEL
ALK PHOS: 73 IU/L (ref 39–117)
ALT: 18 IU/L (ref 0–44)
AST: 18 IU/L (ref 0–40)
Albumin/Globulin Ratio: 1.8 (ref 1.2–2.2)
Albumin: 4.2 g/dL (ref 3.5–5.5)
BUN / CREAT RATIO: 11 (ref 9–20)
BUN: 14 mg/dL (ref 6–24)
Bilirubin Total: <0.2 mg/dL (ref 0.0–1.2)
CALCIUM: 9.4 mg/dL (ref 8.7–10.2)
CO2: 25 mmol/L (ref 20–29)
CREATININE: 1.23 mg/dL (ref 0.76–1.27)
Chloride: 103 mmol/L (ref 96–106)
GFR, EST AFRICAN AMERICAN: 78 mL/min/{1.73_m2} (ref >59)
GFR, EST NON AFRICAN AMERICAN: 67 mL/min/{1.73_m2} (ref >59)
GLOBULIN, TOTAL: 2.4 g/dL (ref 1.5–4.5)
Glucose: 100 mg/dL — ABNORMAL HIGH (ref 65–99)
Potassium: 4.2 mmol/L (ref 3.5–5.2)
SODIUM: 139 mmol/L (ref 134–144)
Total Protein: 6.6 g/dL (ref 6.0–8.5)

## 2016-11-20 LAB — CBC WITH DIFFERENTIAL/PLATELET

## 2016-11-20 LAB — TSH: TSH: 0.579 u[IU]/mL (ref 0.450–4.500)

## 2016-11-20 LAB — HIV ANTIBODY (ROUTINE TESTING W REFLEX): HIV Screen 4th Generation wRfx: NONREACTIVE

## 2016-11-20 NOTE — Addendum Note (Signed)
Addended by: Nat Christen on: 11/20/2016 05:42 PM   Modules accepted: Orders

## 2016-11-22 LAB — CBC WITH DIFFERENTIAL/PLATELET
BASOS: 0 %
Basophils Absolute: 0 10*3/uL (ref 0.0–0.2)
EOS (ABSOLUTE): 0.2 10*3/uL (ref 0.0–0.4)
Eos: 4 %
HEMOGLOBIN: 14.3 g/dL (ref 13.0–17.7)
Hematocrit: 43.7 % (ref 37.5–51.0)
IMMATURE GRANULOCYTES: 0 %
Immature Grans (Abs): 0 10*3/uL (ref 0.0–0.1)
LYMPHS ABS: 1.4 10*3/uL (ref 0.7–3.1)
Lymphs: 30 %
MCH: 32.1 pg (ref 26.6–33.0)
MCHC: 32.7 g/dL (ref 31.5–35.7)
MCV: 98 fL — AB (ref 79–97)
MONOCYTES: 7 %
MONOS ABS: 0.3 10*3/uL (ref 0.1–0.9)
NEUTROS PCT: 59 %
Neutrophils Absolute: 2.6 10*3/uL (ref 1.4–7.0)
Platelets: 235 10*3/uL (ref 150–379)
RBC: 4.45 x10E6/uL (ref 4.14–5.80)
RDW: 14.7 % (ref 12.3–15.4)
WBC: 4.5 10*3/uL (ref 3.4–10.8)

## 2016-12-09 ENCOUNTER — Ambulatory Visit (INDEPENDENT_AMBULATORY_CARE_PROVIDER_SITE_OTHER): Payer: Self-pay | Admitting: Physician Assistant

## 2016-12-09 ENCOUNTER — Encounter (INDEPENDENT_AMBULATORY_CARE_PROVIDER_SITE_OTHER): Payer: Self-pay | Admitting: Physician Assistant

## 2016-12-09 VITALS — BP 110/66 | HR 77 | Temp 98.2°F | Resp 18 | Ht 67.0 in | Wt 255.0 lb

## 2016-12-09 DIAGNOSIS — K5909 Other constipation: Secondary | ICD-10-CM

## 2016-12-09 DIAGNOSIS — R1032 Left lower quadrant pain: Secondary | ICD-10-CM

## 2016-12-09 MED ORDER — LINACLOTIDE 145 MCG PO CAPS: 145.0000 ug | ORAL_CAPSULE | Freq: Every day | ORAL | 0 refills | Status: DC

## 2016-12-09 MED FILL — LINZESS 145 MCG CAPSULE: 145 | 30 days supply | Qty: 30 | Fill #0

## 2016-12-09 NOTE — Progress Notes (Signed)
Subjective:  Patient ID: Gregory Ray, male    DOB: [birth year]  Age: 51 y.o. MRN: [id number]  CC:   HPI Gregory Ray is a RHD 51 y.o. male with a PMH of left wrist scaphoid fracture, left shoulder pain, and constipation presents with LLQ abdominal pain. Onset of LLQ abdominal pain 4 days ago after eating. Pain 10/10 lasting for approximately 10-15 minutes each time pain is provoked. Certain rotational movements and heavy lifting provoke pain.  Associated with fecal urgency, tenesmus, and constipation. However, he reports chronic issues with constipation. Has failed conservative treatments for constipation. No associated f/c/n/v, melena, mucus, hematochezia, or abdominal mass in the affected quadrant. Does not endorse any other symptoms or complaints.       Outpatient Medications Prior to Visit  Medication Sig Dispense Refill  . acetaminophen (TYLENOL) 325 MG tablet Take 650 mg by mouth every 6 (six) hours as needed for mild pain.    . cyclobenzaprine (FLEXERIL) 10 MG tablet Take 1 tablet (10 mg total) by mouth 2 (two) times daily as needed for muscle spasms. 20 tablet 0  . polyethylene glycol powder (GLYCOLAX/MIRALAX) powder Take 17 g by mouth daily. 3350 g 1  . senna-docusate (SENOKOT-S) 8.6-50 MG tablet Take 1 tablet by mouth 2 (two) times daily. 14 tablet 0  . traMADol (ULTRAM) 50 MG tablet Take 1 tablet (50 mg total) by mouth every 6 (six) hours as needed. 80 tablet 0   No facility-administered medications prior to visit.      ROS Review of Systems  Constitutional: Negative for chills, fever and malaise/fatigue.  Eyes: Negative for blurred vision.  Respiratory: Negative for shortness of breath.   Cardiovascular: Negative for chest pain and palpitations.  Gastrointestinal: Positive for abdominal pain and constipation. Negative for blood in stool, diarrhea, melena, nausea and vomiting.  Genitourinary: Negative for dysuria and hematuria.  Musculoskeletal: Positive for back  pain (chronic). Negative for joint pain and myalgias.  Skin: Negative for rash.  Neurological: Negative for tingling and headaches.  Psychiatric/Behavioral: Negative for depression. The patient is not nervous/anxious.     Objective:  BP 110/66 (BP Location: Left Arm, Patient Position: Sitting, Cuff Size: Normal)   Pulse 77   Temp 98.2 F (36.8 C) (Oral)   Resp 18   Ht 5\' 7"  (1.702 m)   Wt 255 lb (115.7 kg)   SpO2 98%   BMI 39.94 kg/m   BP/Weight 12/09/2016 11/19/2016 06/19/6008  Systolic BP 932 355 732  Diastolic BP 66 82 80  Wt. (Lbs) 255 234.2 240.2  BMI 39.94 38.97 43.93      Physical Exam  Constitutional: He is oriented to person, place, and time.  Well developed, obese, NAD, polite  HENT:  Head: Normocephalic and atraumatic.  Eyes: No scleral icterus.  Cardiovascular: Normal rate, regular rhythm and normal heart sounds.   Pulmonary/Chest: Effort normal and breath sounds normal.  Abdominal: Soft. Bowel sounds are normal. He exhibits no distension and no mass. There is tenderness (Mild focal TTP in the LLQ, no appreciable mass felt.). There is no rebound and no guarding.  Musculoskeletal: He exhibits no edema.  Neurological: He is alert and oriented to person, place, and time. No cranial nerve deficit. Coordination normal.  Skin: Skin is warm and dry. No rash noted. No erythema. No pallor.  Psychiatric: He has a normal mood and affect. His behavior is normal. Thought content normal.  Vitals reviewed.    Assessment & Plan:    1. Left lower  quadrant pain - Chronic constipation vs hernia vs less likely diverticulitis - CBC with Differential - Sedimentation Rate - DG Abd 1 View; Future  2. Chronic constipation - Begin linaclotide (LINZESS) 145 MCG CAPS capsule; Take 1 capsule (145 mcg total) by mouth daily before breakfast.  Dispense: 30 capsule; Refill: 0   Meds ordered this encounter  Medications  . linaclotide (LINZESS) 145 MCG CAPS capsule    Sig: Take 1  capsule (145 mcg total) by mouth daily before breakfast.    Dispense:  30 capsule    Refill:  0    Order Specific Question:   Supervising Provider    Answer:   Tresa Garter [[id number]]    Follow-up: Return in about 2 weeks (around 12/23/2016) for LLQ abdominal pain.   Clent Demark PA

## 2016-12-09 NOTE — Patient Instructions (Addendum)
Abdominal Pain, Adult Abdominal pain can be caused by many things. Often, abdominal pain is not serious and it gets better with no treatment or by being treated at home. However, sometimes abdominal pain is serious. Your health care provider will do a medical history and a physical exam to try to determine the cause of your abdominal pain. Follow these instructions at home:  Take over-the-counter and prescription medicines only as told by your health care provider. Do not take a laxative unless told by your health care provider.  Drink enough fluid to keep your urine clear or pale yellow.  Watch your condition for any changes.  Keep all follow-up visits as told by your health care provider. This is important. Contact a health care provider if:  Your abdominal pain changes or gets worse.  You are not hungry or you lose weight without trying.  You are constipated or have diarrhea for more than 2-3 days.  You have pain when you urinate or have a bowel movement.  Your abdominal pain wakes you up at night.  Your pain gets worse with meals, after eating, or with certain foods.  You are throwing up and cannot keep anything down.  You have a fever. Get help right away if:  Your pain does not go away as soon as your health care provider told you to expect.  You cannot stop throwing up.  Your pain is only in areas of the abdomen, such as the right side or the left lower portion of the abdomen.  You have bloody or black stools, or stools that look like tar.  You have severe pain, cramping, or bloating in your abdomen.  You have signs of dehydration, such as: ? Dark urine, very little urine, or no urine. ? Cracked lips. ? Dry mouth. ? Sunken eyes. ? Sleepiness. ? Weakness. This information is not intended to replace advice given to you by your health care provider. Make sure you discuss any questions you have with your health care provider. Document Released: 01/15/2005 Document  Revised: 10/26/2015 Document Reviewed: 09/19/2015 Elsevier Interactive Patient Education  2017 [place] oral capsules What is this medicine? LINACLOTIDE (lin a KLOE tide) is used to treat irritable bowel syndrome (IBS) with constipation as the main problem. It may also be used for relief of chronic constipation. This medicine may be used for other purposes; ask your health care provider or pharmacist if you have questions. COMMON BRAND NAME(S): Linzess What should I tell my health care provider before I take this medicine? They need to know if you have any of these conditions: -history of stool (fecal) impaction -now have diarrhea or have diarrhea often -other medical condition -stomach or intestinal disease, including bowel obstruction or abdominal adhesions -an unusual or allergic reaction to linaclotide, other medicines, foods, dyes, or preservatives -pregnant or trying to get pregnant -breast-feeding How should I use this medicine? Take this medicine by mouth with a glass of water. Follow the directions on the prescription label. Do not cut, crush or chew this medicine. Take on an empty stomach, at least 30 minutes before your first meal of the day. Take your medicine at regular intervals. Do not take your medicine more often than directed. Do not stop taking except on your doctor's advice. A special MedGuide will be given to you by the pharmacist with each prescription and refill. Be sure to read this information carefully each time. Talk to your pediatrician regarding the use of this medicine in children. This  medicine is not approved for use in children. Overdosage: If you think you have taken too much of this medicine contact a poison control center or emergency room at once. NOTE: This medicine is only for you. Do not share this medicine with others. What if I miss a dose? If you miss a dose, just skip that dose. Wait until your next dose, and take only that dose. Do  not take double or extra doses. What may interact with this medicine? -certain medicines for bowel problems or bladder incontinence (these can cause constipation) This list may not describe all possible interactions. Give your health care provider a list of all the medicines, herbs, non-prescription drugs, or dietary supplements you use. Also tell them if you smoke, drink alcohol, or use illegal drugs. Some items may interact with your medicine. What should I watch for while using this medicine? Visit your doctor for regular check ups. Tell your doctor if your symptoms do not get better or if they get worse. Diarrhea is a common side effect of this medicine. It often begins within 2 weeks of starting this medicine. Stop taking this medicine and call your doctor if you get severe diarrhea. Stop taking this medicine and call your doctor or go to the nearest hospital emergency room right away if you develop unusual or severe stomach-area (abdominal) pain, especially if you also have bright red, bloody stools or black stools that look like tar. What side effects may I notice from receiving this medicine? Side effects that you should report to your doctor or health care professional as soon as possible: -allergic reactions like skin rash, itching or hives, swelling of the face, lips, or tongue -black, tarry stools -bloody or watery diarrhea -new or worsening stomach pain -severe or prolonged diarrhea Side effects that usually do not require medical attention (report to your doctor or health care professional if they continue or are bothersome): -bloating -gas -loose stools This list may not describe all possible side effects. Call your doctor for medical advice about side effects. You may report side effects to FDA at 1-800-FDA-1088. Where should I keep my medicine? Keep out of the reach of children. Store at room temperature between 20 and 25 degrees C (68 and 77 degrees F). Keep this medicine in  the original container. Keep tightly closed in a dry place. Do not remove the desiccant packet from the bottle, it helps to protect your medicine from moisture. Throw away any unused medicine after the expiration date. NOTE: This sheet is a summary. It may not cover all possible information. If you have questions about this medicine, talk to your doctor, pharmacist, or health care provider.  2018 Elsevier/Gold Standard (2015-05-10 12:17:04)

## 2016-12-10 LAB — CBC WITH DIFFERENTIAL/PLATELET
BASOS ABS: 0 10*3/uL (ref 0.0–0.2)
Basos: 1 %
EOS (ABSOLUTE): 0.1 10*3/uL (ref 0.0–0.4)
Eos: 3 %
Hematocrit: 43.8 % (ref 37.5–51.0)
Hemoglobin: 14.2 g/dL (ref 13.0–17.7)
IMMATURE GRANS (ABS): 0 10*3/uL (ref 0.0–0.1)
Immature Granulocytes: 1 %
LYMPHS: 31 %
Lymphocytes Absolute: 1.3 10*3/uL (ref 0.7–3.1)
MCH: 30.5 pg (ref 26.6–33.0)
MCHC: 32.4 g/dL (ref 31.5–35.7)
MCV: 94 fL (ref 79–97)
Monocytes Absolute: 0.4 10*3/uL (ref 0.1–0.9)
Monocytes: 9 %
NEUTROS ABS: 2.5 10*3/uL (ref 1.4–7.0)
Neutrophils: 55 %
PLATELETS: 238 10*3/uL (ref 150–379)
RBC: 4.65 x10E6/uL (ref 4.14–5.80)
RDW: 14.3 % (ref 12.3–15.4)
WBC: 4.4 10*3/uL (ref 3.4–10.8)

## 2016-12-10 LAB — SEDIMENTATION RATE: SED RATE: 3 mm/h (ref 0–30)

## 2016-12-11 ENCOUNTER — Ambulatory Visit (HOSPITAL_COMMUNITY)
Admission: RE | Admit: 2016-12-11 | Discharge: 2016-12-11 | Disposition: A | Discharge status: Home / Self Care | Payer: Medicaid Other | Source: Ambulatory Visit | Attending: Physician Assistant | Admitting: Physician Assistant

## 2016-12-11 DIAGNOSIS — K5900 Constipation, unspecified: Secondary | ICD-10-CM | POA: Insufficient documentation

## 2016-12-11 DIAGNOSIS — R1032 Left lower quadrant pain: Secondary | ICD-10-CM

## 2016-12-12 ENCOUNTER — Ambulatory Visit: Payer: Self-pay | Attending: Internal Medicine

## 2016-12-23 ENCOUNTER — Encounter (INDEPENDENT_AMBULATORY_CARE_PROVIDER_SITE_OTHER): Payer: Self-pay | Admitting: Physician Assistant

## 2016-12-23 ENCOUNTER — Ambulatory Visit (INDEPENDENT_AMBULATORY_CARE_PROVIDER_SITE_OTHER): Payer: Self-pay | Admitting: Physician Assistant

## 2016-12-23 VITALS — BP 119/83 | HR 88 | Temp 97.9°F | Wt 227.6 lb

## 2016-12-23 DIAGNOSIS — R1032 Left lower quadrant pain: Secondary | ICD-10-CM

## 2016-12-23 DIAGNOSIS — Z23 Encounter for immunization: Secondary | ICD-10-CM

## 2016-12-23 DIAGNOSIS — K5900 Constipation, unspecified: Secondary | ICD-10-CM

## 2016-12-23 DIAGNOSIS — M25532 Pain in left wrist: Secondary | ICD-10-CM

## 2016-12-23 MED ORDER — SENNOSIDES-DOCUSATE SODIUM 8.6-50 MG PO TABS: 1.0000 | ORAL_TABLET | Freq: Two times a day (BID) | ORAL | 0 refills | Status: DC

## 2016-12-23 MED ORDER — LINACLOTIDE 145 MCG PO CAPS: 145.0000 ug | ORAL_CAPSULE | Freq: Every day | ORAL | 0 refills | Status: DC

## 2016-12-23 NOTE — Patient Instructions (Signed)
Linaclotide oral capsules  What is this medicine?  LINACLOTIDE (lin a KLOE tide) is used to treat irritable bowel syndrome (IBS) with constipation as the main problem. It may also be used for relief of chronic constipation.  This medicine may be used for other purposes; ask your health care provider or pharmacist if you have questions.  COMMON BRAND NAME(S): Linzess  What should I tell my health care provider before I take this medicine?  They need to know if you have any of these conditions:  -history of stool (fecal) impaction  -now have diarrhea or have diarrhea often  -other medical condition  -stomach or intestinal disease, including bowel obstruction or abdominal adhesions  -an unusual or allergic reaction to linaclotide, other medicines, foods, dyes, or preservatives  -pregnant or trying to get pregnant  -breast-feeding  How should I use this medicine?  Take this medicine by mouth with a glass of water. Follow the directions on the prescription label. Do not cut, crush or chew this medicine. Take on an empty stomach, at least 30 minutes before your first meal of the day. Take your medicine at regular intervals. Do not take your medicine more often than directed. Do not stop taking except on your doctor's advice.  A special MedGuide will be given to you by the pharmacist with each prescription and refill. Be sure to read this information carefully each time.  Talk to your pediatrician regarding the use of this medicine in children. This medicine is not approved for use in children.  Overdosage: If you think you have taken too much of this medicine contact a poison control center or emergency room at once.  NOTE: This medicine is only for you. Do not share this medicine with others.  What if I miss a dose?  If you miss a dose, just skip that dose. Wait until your next dose, and take only that dose. Do not take double or extra doses.  What may interact with this medicine?  -certain medicines for bowel problems  or bladder incontinence (these can cause constipation)  This list may not describe all possible interactions. Give your health care provider a list of all the medicines, herbs, non-prescription drugs, or dietary supplements you use. Also tell them if you smoke, drink alcohol, or use illegal drugs. Some items may interact with your medicine.  What should I watch for while using this medicine?  Visit your doctor for regular check ups. Tell your doctor if your symptoms do not get better or if they get worse.  Diarrhea is a common side effect of this medicine. It often begins within 2 weeks of starting this medicine. Stop taking this medicine and call your doctor if you get severe diarrhea.  Stop taking this medicine and call your doctor or go to the nearest hospital emergency room right away if you develop unusual or severe stomach-area (abdominal) pain, especially if you also have bright red, bloody stools or black stools that look like tar.  What side effects may I notice from receiving this medicine?  Side effects that you should report to your doctor or health care professional as soon as possible:  -allergic reactions like skin rash, itching or hives, swelling of the face, lips, or tongue  -black, tarry stools  -bloody or watery diarrhea  -new or worsening stomach pain  -severe or prolonged diarrhea  Side effects that usually do not require medical attention (report to your doctor or health care professional if they continue or are   my medicine? Keep out of the reach of children. Store at room temperature between 20 and 25 degrees C (68 and 77 degrees F). Keep this medicine in the original container. Keep tightly closed in a dry place. Do not remove the desiccant packet from the bottle,  it helps to protect your medicine from moisture. Throw away any unused medicine after the expiration date. NOTE: This sheet is a summary. It may not cover all possible information. If you have questions about this medicine, talk to your doctor, pharmacist, or health care provider.  2018 Elsevier/Gold Standard (2015-05-10 12:17:04)  

## 2016-12-23 NOTE — Progress Notes (Signed)
Subjective:  Patient ID: Gregory Ray, male    DOB: 28-Feb-1966  Age: 51 y.o. MRN: [id number]  CC: f/u LLQ pain  HPI  Gregory Ray a RHD 51 y.o.malewith a PMH of left wrist scaphoid fracture, left shoulder pain, and constipation presents to f/u on LLQ abdominal pain. CBC, Sed rate, and KUB were normal and revealed no excessive stool burden. Prescribed Linzess 145 qday x30 days. Feels Much better in regards to LLQ pain and to constipation since taking Linzess.     Left wrist pain and weakness attributed to his scaphoid fracture. Was referred to orthopedics but has not gone because his Medicaid Family Planning was rejected. 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 Old triquetrum bone fracture. Degenerative changes of the radiocarpal joint and carpal bones.     Outpatient Medications Prior to Visit  Medication Sig Dispense Refill  . acetaminophen (TYLENOL) 325 MG tablet Take 650 mg by mouth every 6 (six) hours as needed for mild pain.    Marland Kitchen linaclotide (LINZESS) 145 MCG CAPS capsule Take 1 capsule (145 mcg total) by mouth daily before breakfast. 30 capsule 0  . polyethylene glycol powder (GLYCOLAX/MIRALAX) powder Take 17 g by mouth daily. 3350 g 1  . senna-docusate (SENOKOT-S) 8.6-50 MG tablet Take 1 tablet by mouth 2 (two) times daily. 14 tablet 0   No facility-administered medications prior to visit.      ROS Review of Systems  Constitutional: Negative for chills, fever and malaise/fatigue.  Eyes: Negative for blurred vision.  Respiratory: Negative for shortness of breath.   Cardiovascular: Negative for chest pain and palpitations.  Gastrointestinal: Negative for abdominal pain and nausea.  Genitourinary: Negative for dysuria and hematuria.  Musculoskeletal: Positive for joint pain. Negative for myalgias.  Skin:  Negative for rash.  Neurological: Negative for tingling and headaches.  Psychiatric/Behavioral: Negative for depression. The patient is not nervous/anxious.     Objective:  BP 119/83 (BP Location: Right Arm, Patient Position: Sitting, Cuff Size: Large)   Pulse 88   Temp 97.9 F (36.6 C) (Oral)   Wt 227 lb 9.6 oz (103.2 kg)   SpO2 95%   BMI 35.65 kg/m   BP/Weight 12/23/2016 1/44/3154 0/0/8676  Systolic BP 195 093 267  Diastolic BP 83 66 82  Wt. (Lbs) 227.6 255 234.2  BMI 35.65 39.94 38.97      Physical Exam  Constitutional: He is oriented to person, place, and time.  Well developed, obese, NAD, polite  HENT:  Head: Normocephalic and atraumatic.  Eyes: No scleral icterus.  Neck: Normal range of motion.  Cardiovascular: Normal rate, regular rhythm and normal heart sounds.   Pulmonary/Chest: Effort normal and breath sounds normal.  Abdominal: Soft. Bowel sounds are normal. There is no tenderness.  Musculoskeletal: He exhibits no edema.  Left side Finkelstein test Positive.  Neurological: He is alert and oriented to person, place, and time. No cranial nerve deficit. Coordination normal.  Skin: Skin is warm and dry. No rash noted. No erythema. No pallor.  Psychiatric: He has a normal mood and affect. His behavior is normal. Thought content normal.  Vitals reviewed.    Assessment & Plan:   1. Left lower quadrant pain - Resolved with Linzess and Senokot-S  2. Constipation, unspecified constipation type - Refill linaclotide (LINZESS) 145 MCG CAPS capsule; Take 1 capsule (145 mcg total) by mouth daily  before breakfast.  Dispense: 30 capsule; Refill: 0 - Refill senna-docusate (SENOKOT-S) 8.6-50 MG tablet; Take 1 tablet by mouth 2 (two) times daily.  Dispense: 14 tablet; Refill: 0  3. Left wrist pain - Pt advised to fill [employer] Program application and [employer] application to have any future procedures possibly discounted.  - Patient advised to wear his thumb  spica splint but pt declines due to pain caused by the pressure of tying down his thumb spica splint. 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 Old triquetrum bone fracture. Degenerative changes of the radiocarpal joint and carpal bones.   * Influenza vaccine administered in clinic today.   Meds ordered this encounter  Medications  . linaclotide (LINZESS) 145 MCG CAPS capsule    Sig: Take 1 capsule (145 mcg total) by mouth daily before breakfast.    Dispense:  30 capsule    Refill:  0    Order Specific Question:   Supervising Provider    Answer:   Tresa Garter [id number]  . senna-docusate (SENOKOT-S) 8.6-50 MG tablet    Sig: Take 1 tablet by mouth 2 (two) times daily.    Dispense:  14 tablet    Refill:  0    Order Specific Question:   Supervising Provider    Answer:   Tresa Garter [id number]    Follow-up: No Follow-up on file.   Clent Demark PA

## 2016-12-30 ENCOUNTER — Encounter: Payer: Self-pay | Admitting: Gastroenterology

## 2017-01-06 ENCOUNTER — Other Ambulatory Visit: Payer: Self-pay | Admitting: *Deleted

## 2017-01-06 DIAGNOSIS — K5900 Constipation, unspecified: Secondary | ICD-10-CM

## 2017-01-06 MED ORDER — LINACLOTIDE 145 MCG PO CAPS: 145.0000 ug | ORAL_CAPSULE | Freq: Every day | ORAL | 3 refills | Status: DC

## 2017-01-06 NOTE — Telephone Encounter (Signed)
PRINTED FOR PASS PROGRAM 

## 2017-02-18 ENCOUNTER — Ambulatory Visit (AMBULATORY_SURGERY_CENTER): Payer: Self-pay | Admitting: *Deleted

## 2017-02-18 VITALS — Ht 65.0 in | Wt 227.6 lb

## 2017-02-18 DIAGNOSIS — Z1211 Encounter for screening for malignant neoplasm of colon: Secondary | ICD-10-CM

## 2017-02-18 MED ORDER — SUPREP BOWEL PREP KIT 17.5-3.13-1.6 GM/177ML PO SOLN: 1.0000 | Freq: Once | ORAL | 0 refills | Status: AC

## 2017-02-18 NOTE — Progress Notes (Signed)
Patient denies any allergies to egg or soy products. Patient denies complications with anesthesia/sedation.  Patient denies oxygen use at home and denies diet medications. Pamphlet on colonoscopy procedure given to patient.

## 2017-02-20 MED FILL — SUPREP BOWEL PREP KIT: 17.5-3.13-1 | 1 days supply | Qty: 354 | Fill #0

## 2017-02-23 MED FILL — $LINZESS 145MCG CAPSULE: 145 | 30 days supply | Qty: 30 | Fill #0

## 2017-02-25 ENCOUNTER — Encounter (INDEPENDENT_AMBULATORY_CARE_PROVIDER_SITE_OTHER): Payer: Self-pay | Admitting: Physician Assistant

## 2017-02-25 ENCOUNTER — Ambulatory Visit (INDEPENDENT_AMBULATORY_CARE_PROVIDER_SITE_OTHER): Payer: Self-pay | Admitting: Physician Assistant

## 2017-02-25 VITALS — BP 109/72 | HR 78 | Temp 98.0°F | Wt 231.2 lb

## 2017-02-25 DIAGNOSIS — M545 Low back pain, unspecified: Secondary | ICD-10-CM

## 2017-02-25 DIAGNOSIS — Z029 Encounter for administrative examinations, unspecified: Secondary | ICD-10-CM

## 2017-02-25 LAB — POCT URINALYSIS DIPSTICK
Glucose, UA: NEGATIVE
Leukocytes, UA: NEGATIVE
Nitrite, UA: NEGATIVE
PH UA: 5.5 (ref 5.0–8.0)
Protein, UA: NEGATIVE
RBC UA: NEGATIVE
Spec Grav, UA: 1.025 (ref 1.010–1.025)
Urobilinogen, UA: 0.2 E.U./dL

## 2017-02-25 MED ORDER — ACETAMINOPHEN-CODEINE #3 300-30 MG PO TABS: 1.0000 | ORAL_TABLET | Freq: Four times a day (QID) | ORAL | 0 refills | Status: AC | PRN

## 2017-02-25 MED ORDER — CYCLOBENZAPRINE HCL 10 MG PO TABS: 10.0000 mg | ORAL_TABLET | Freq: Every day | ORAL | 0 refills | Status: DC

## 2017-02-25 MED FILL — ?CYCLOBENZAPRINE 10 MG TABL: 10 | 10 days supply | Qty: 10 | Fill #0

## 2017-02-25 NOTE — Progress Notes (Signed)
Subjective:  Patient ID: Gregory Ray, male    DOB: 09-Oct-1965  Age: 51 y.o. MRN: [id number]  CC: back pain and  HPI Gregory Ray is a 51 y.o. male with a medical history of arthritis(hands, shoulders), constipation, Marijuana use, constipation, and hemorrhoids presents with one week of acute  lower back pain. Does not recall lifting, falling, trauma, or any other attributable cause of his back pain. Does not endorse hematuria, dysuria, urinary frequency, or f/c/n/v. Has not taken anything for relief.     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 Requests to be filled for his left shoulder and left wrist pain.      Outpatient Medications Prior to Visit  Medication Sig Dispense Refill  . acetaminophen (TYLENOL) 325 MG tablet Take 650 mg by mouth every 6 (six) hours as needed for mild pain.    . bisacodyl (BISACODYL) 5 MG EC tablet Take 5 mg by mouth daily as needed for moderate constipation. Dulcolax 5 mg tab take as directed for colonoscopy prep.    Marland Kitchen linaclotide (LINZESS) 145 MCG CAPS capsule Take 1 capsule (145 mcg total) by mouth daily before breakfast. (Patient taking differently: Take 145 mcg by mouth as needed. ) 90 capsule 3  . polyethylene glycol powder (GLYCOLAX/MIRALAX) powder Take 17 g by mouth daily. 3350 g 1  . senna-docusate (SENOKOT-S) 8.6-50 MG tablet Take 1 tablet by mouth 2 (two) times daily. 14 tablet 0  . Polyethylene Glycol 3350 (MIRALAX PO) Take by mouth. Miralax 119 grams as directed for colonoscopy prep.     No facility-administered medications prior to visit.      ROS Review of Systems  Constitutional: Negative for chills, fever and malaise/fatigue.  Eyes: Negative for blurred vision.  Respiratory: Negative for shortness of breath.   Cardiovascular: Negative for chest pain and palpitations.  Gastrointestinal: Negative for abdominal pain and nausea.  Genitourinary: Negative for dysuria and hematuria.  Musculoskeletal: Positive for back pain  and joint pain. Negative for myalgias.  Skin: Negative for rash.  Neurological: Negative for tingling and headaches.  Psychiatric/Behavioral: Negative for depression. The patient is not nervous/anxious.     Objective:  BP 109/72 (BP Location: Right Arm, Patient Position: Sitting, Cuff Size: Large)   Pulse 78   Temp 98 F (36.7 C) (Oral)   Wt 231 lb 3.2 oz (104.9 kg)   SpO2 92%   BMI 38.47 kg/m   BP/Weight 02/25/2017 38/18/2993 10/19/6965  Systolic BP 893 - 810  Diastolic BP 72 - 83  Wt. (Lbs) 231.2 227.6 227.6  BMI 38.47 37.87 35.65      Physical Exam  Constitutional: He is oriented to person, place, and time.  Well developed, obese, bent over on exam table in apparent discomfort, polite  HENT:  Head: Normocephalic and atraumatic.  Eyes: Conjunctivae are normal. No scleral icterus.  Neck: Normal range of motion. Neck supple. No thyromegaly present.  Pulmonary/Chest: Effort normal.  Musculoskeletal:  Patient unable to stand erect due to back pain. TTP over the paraspinals of the L4-L5 region.  Neurological: He is alert and oriented to person, place, and time. No cranial nerve deficit. Coordination normal.  Skin: Skin is warm and dry. No rash noted. No erythema. No pallor.  Psychiatric: He has a normal mood and affect. His behavior is normal. Thought content normal.  Vitals reviewed.    Assessment & Plan:     1. Acute bilateral low back pain without sciatica - Urinalysis Dipstick negative for blood, nitrites,  and leukocytes - Begin Tylenol #3 - Begin Cyclobenzaprine - Allergic to NSAIDs  2. Administrative encounter - Disability paperwork filled during this visit      Meds ordered this encounter  Medications  . acetaminophen-codeine (TYLENOL #3) 300-30 MG tablet    Sig: Take 1 tablet every 6 (six) hours as needed for up to 7 days by mouth for moderate pain.    Dispense:  28 tablet    Refill:  0    Order Specific Question:   Supervising Provider    Answer:    Tresa Garter [id number]  . cyclobenzaprine (FLEXERIL) 10 MG tablet    Sig: Take 1 tablet (10 mg total) at bedtime by mouth.    Dispense:  10 tablet    Refill:  0    Order Specific Question:   Supervising Provider    Answer:   Tresa Garter [[id number]]    Follow-up: PRN  Clent Demark PA

## 2017-02-25 NOTE — Patient Instructions (Signed)

## 2017-02-27 MED FILL — ACETAMINOPHEN/COD #3 TABLET: 300-30 | 7 days supply | Qty: 28 | Fill #0

## 2017-03-02 ENCOUNTER — Ambulatory Visit (AMBULATORY_SURGERY_CENTER): Payer: Self-pay | Admitting: Gastroenterology

## 2017-03-02 ENCOUNTER — Other Ambulatory Visit: Payer: Self-pay

## 2017-03-02 ENCOUNTER — Encounter: Payer: Self-pay | Admitting: Gastroenterology

## 2017-03-02 VITALS — BP 116/70 | HR 63 | Temp 98.0°F | Resp 34 | Ht 65.0 in | Wt 227.0 lb

## 2017-03-02 DIAGNOSIS — Z1211 Encounter for screening for malignant neoplasm of colon: Secondary | ICD-10-CM

## 2017-03-02 DIAGNOSIS — D123 Benign neoplasm of transverse colon: Secondary | ICD-10-CM

## 2017-03-02 DIAGNOSIS — Z1212 Encounter for screening for malignant neoplasm of rectum: Secondary | ICD-10-CM

## 2017-03-02 MED ORDER — SODIUM CHLORIDE 0.9 % IV SOLN: 500.0000 mL | INTRAVENOUS | Status: DC

## 2017-03-02 NOTE — Progress Notes (Signed)
A/ox3 pleased with MAC, report to Michele RN 

## 2017-03-02 NOTE — Op Note (Signed)
[place] Patient Name: Gregory Ray Procedure Date: 03/02/2017 11:34 AM MRN: [id number] Endoscopist: Mallie Mussel L. Loletha Carrow , MD Age: 51 Referring MD:  Date of Birth: Feb 24, 1966 Gender: Male Account #: [id number] Procedure:                Colonoscopy Indications:              Screening for colorectal malignant neoplasm, This                            is the patient's first colonoscopy Medicines:                Monitored Anesthesia Care Procedure:                Pre-Anesthesia Assessment:                           - Prior to the procedure, a History and Physical                            was performed, and patient medications and                            allergies were reviewed. The patient's tolerance of                            previous anesthesia was also reviewed. The risks                            and benefits of the procedure and the sedation                            options and risks were discussed with the patient.                            All questions were answered, and informed consent                            was obtained. Prior Anticoagulants: The patient has                            taken no previous anticoagulant or antiplatelet                            agents. ASA Grade Assessment: II - A patient with                            mild systemic disease. After reviewing the risks                            and benefits, the patient was deemed in                            satisfactory condition to undergo the procedure.  After obtaining informed consent, the colonoscope                            was passed under direct vision. Throughout the                            procedure, the patient's blood pressure, pulse, and                            oxygen saturations were monitored continuously. The                            Model CF-HQ190L [id number]) scope was introduced                            through the anus and  advanced to the the cecum,                            identified by appendiceal orifice and ileocecal                            valve. The colonoscopy was performed without                            difficulty. The patient tolerated the procedure                            well. The quality of the bowel preparation was                            excellent. The ileocecal valve, appendiceal                            orifice, and rectum were photographed. The quality                            of the bowel preparation was evaluated using the                            BBPS [facility] Bowel Preparation Scale) with scores                            of: Right Colon = 3, Transverse Colon = 3 and Left                            Colon = 3 (entire mucosa seen well with no residual                            staining, small fragments of stool or opaque                            liquid). The total BBPS score equals 9. The bowel  preparation used was SUPREP. Scope In: 11:44:28 AM Scope Out: 11:57:11 AM Scope Withdrawal Time: 0 hours 10 minutes 28 seconds  Total Procedure Duration: 0 hours 12 minutes 43 seconds  Findings:                 The digital rectal exam findings include internal                            hemorrhoids that prolapse with straining, but                            spontaneously regress to the resting position                            (Grade II).                           A 2 mm polyp was found in the transverse colon. The                            polyp was sessile. The polyp was removed with a                            cold biopsy forceps. Resection and retrieval were                            complete.                           Inflamed internal hemorrhoids were found during                            retroflexion. The hemorrhoids were large and Grade                            II (internal hemorrhoids that prolapse but reduce                             spontaneously). There were also prominent rectal                            veins.                           The exam was otherwise without abnormality on                            direct and retroflexion views. Complications:            No immediate complications. Estimated Blood Loss:     Estimated blood loss: none. Impression:               - Internal hemorrhoids that prolapse with                            straining, but spontaneously regress to the resting  position (Grade II) found on digital rectal exam.                           - One 2 mm polyp in the transverse colon, removed                            with a cold biopsy forceps. Resected and retrieved.                           - Internal hemorrhoids.                           - The examination was otherwise normal on direct                            and retroflexion views. Recommendation:           - Patient has a contact number available for                            emergencies. The signs and symptoms of potential                            delayed complications were discussed with the                            patient. Return to normal activities tomorrow.                            Written discharge instructions were provided to the                            patient.                           - Resume previous diet.                           - Continue present medications.                           - Await pathology results.                           - Repeat colonoscopy is recommended for                            surveillance. The colonoscopy date will be                            determined after pathology results from today's                            exam become available for review. Makennah Omura L. Loletha Carrow, MD 03/02/2017 12:02:11 PM This report has been signed electronically.

## 2017-03-02 NOTE — Patient Instructions (Signed)
YOU HAD AN ENDOSCOPIC PROCEDURE TODAY AT THE [facility]:   Refer to the procedure report that was given to you for any specific questions about what was found during the examination.  If the procedure report does not answer your questions, please call your gastroenterologist to clarify.  If you requested that your care partner not be given the details of your procedure findings, then the procedure report has been included in a sealed envelope for you to review at your convenience later.  YOU SHOULD EXPECT: Some feelings of bloating in the abdomen. Passage of more gas than usual.  Walking can help get rid of the air that was put into your GI tract during the procedure and reduce the bloating. If you had a lower endoscopy (such as a colonoscopy or flexible sigmoidoscopy) you may notice spotting of blood in your stool or on the toilet paper. If you underwent a bowel prep for your procedure, you may not have a normal bowel movement for a few days.  Please Note:  You might notice some irritation and congestion in your nose or some drainage.  This is from the oxygen used during your procedure.  There is no need for concern and it should clear up in a day or so.  SYMPTOMS TO REPORT IMMEDIATELY:   Following lower endoscopy (colonoscopy or flexible sigmoidoscopy):  Excessive amounts of blood in the stool  Significant tenderness or worsening of abdominal pains  Swelling of the abdomen that is new, acute  Fever of 100F or higher  For urgent or emergent issues, a gastroenterologist can be reached at any hour by calling [phone].   DIET:  We do recommend a small meal at first, but then you may proceed to your regular diet.  Drink plenty of fluids but you should avoid alcoholic beverages for 24 hours.  ACTIVITY:  You should plan to take it easy for the rest of today and you should NOT DRIVE or use heavy machinery until tomorrow (because of the sedation medicines used during the test).     FOLLOW UP: Our staff will call the number listed on your records the next business day following your procedure to check on you and address any questions or concerns that you may have regarding the information given to you following your procedure. If we do not reach you, we will leave a message.  However, if you are feeling well and you are not experiencing any problems, there is no need to return our call.  We will assume that you have returned to your regular daily activities without incident.  If any biopsies were taken you will be contacted by phone or by letter within the next 1-3 weeks.  Please call us at [phone] if you have not heard about the biopsies in 3 weeks.   Await for biopsy results to determine next repeat Colonoscopy screening Polyps (handout given) Hemorrhoids (handout given)   SIGNATURES/CONFIDENTIALITY: You and/or your care partner have signed paperwork which will be entered into your electronic medical record.  These signatures attest to the fact that that the information above on your After Visit Summary has been reviewed and is understood.  Full responsibility of the confidentiality of this discharge information lies with you and/or your care-partner. 

## 2017-03-02 NOTE — Progress Notes (Signed)
Called to room to assist during endoscopic procedure.  Patient ID and intended procedure confirmed with present staff. Received instructions for my participation in the procedure from the performing physician.  

## 2017-03-02 NOTE — Progress Notes (Signed)
Pt's states no medical or surgical changes since previsit or office visit. 

## 2017-03-03 ENCOUNTER — Telehealth: Payer: Self-pay | Admitting: *Deleted

## 2017-03-03 NOTE — Telephone Encounter (Signed)
Patient called for post procedure call back. He isnt experiencing any pain, eating and drinking ok. No questions regarding his procedure and back to normal activities. SM

## 2017-03-06 ENCOUNTER — Encounter: Payer: Self-pay | Admitting: Gastroenterology

## 2017-04-08 ENCOUNTER — Ambulatory Visit: Payer: Self-pay | Attending: Physician Assistant

## 2017-05-19 ENCOUNTER — Ambulatory Visit (INDEPENDENT_AMBULATORY_CARE_PROVIDER_SITE_OTHER): Payer: Self-pay | Admitting: Physician Assistant

## 2017-05-19 ENCOUNTER — Encounter (INDEPENDENT_AMBULATORY_CARE_PROVIDER_SITE_OTHER): Payer: Self-pay | Admitting: Physician Assistant

## 2017-05-19 VITALS — BP 112/65 | HR 69 | Temp 97.9°F | Resp 18 | Ht 65.0 in | Wt 239.0 lb

## 2017-05-19 DIAGNOSIS — T148XXA Other injury of unspecified body region, initial encounter: Secondary | ICD-10-CM

## 2017-05-19 MED ORDER — METHOCARBAMOL 500 MG PO TABS: 500.0000 mg | ORAL_TABLET | Freq: Three times a day (TID) | ORAL | 0 refills | Status: DC

## 2017-05-19 MED ORDER — ACETAMINOPHEN 500 MG PO TABS: 1000.0000 mg | ORAL_TABLET | Freq: Three times a day (TID) | ORAL | 0 refills | Status: AC | PRN

## 2017-05-19 MED FILL — METHOCARBAMOL 500 MG TABLET: 500 | 10 days supply | Qty: 30 | Fill #0

## 2017-05-19 NOTE — Patient Instructions (Signed)
Muscle Strain A muscle strain (pulled muscle) happens when a muscle is stretched beyond normal length. It happens when a sudden, violent force stretches your muscle too far. Usually, a few of the fibers in your muscle are torn. Muscle strain is common in athletes. Recovery usually takes 1-2 weeks. Complete healing takes 5-6 weeks. Follow these instructions at home:  Follow the PRICE method of treatment to help your injury get better. Do this the first 2-3 days after the injury: ? Protect. Protect the muscle to keep it from getting injured again. ? Rest. Limit your activity and rest the injured body part. ? Ice. Put ice in a plastic bag. Place a towel between your skin and the bag. Then, apply the ice and leave it on from 15-20 minutes each hour. After the third day, switch to moist heat packs. ? Compression. Use a splint or elastic bandage on the injured area for comfort. Do not put it on too tightly. ? Elevate. Keep the injured body part above the level of your heart.  Only take medicine as told by your doctor.  Warm up before doing exercise to prevent future muscle strains. Contact a doctor if:  You have more pain or puffiness (swelling) in the injured area.  You feel numbness, tingling, or notice a loss of strength in the injured area. This information is not intended to replace advice given to you by your health care provider. Make sure you discuss any questions you have with your health care provider. Document Released: 01/15/2008 Document Revised: 09/13/2015 Document Reviewed: 11/04/2012 Elsevier Interactive Patient Education  2017 Elsevier Inc.  

## 2017-05-19 NOTE — Progress Notes (Signed)
Subjective:  Patient ID: Gregory Ray, male    DOB: 04-30-1965  Age: 52 y.o. MRN: [id number]  CC: pain on right flank  HPI  Gregory Ray is a 52 y.o. male with a medical history of arthritis(hands, shoulders), constipation, Marijuana use, constipation, and hemorrhoids presents with pain in the left  flank since 3-4 days ago. Happened when he was moving a [employer]. Took tylenol with some relief. Overall, feeling better but pain is aggravated when he lifts any other heavy items. Rotational movements of the torso and pulling with the left obliques aggravates pain. Does not endorse any other symptoms or complaints.      Outpatient Medications Prior to Visit  Medication Sig Dispense Refill  . acetaminophen (TYLENOL) 325 MG tablet Take 650 mg by mouth every 6 (six) hours as needed for mild pain.    . bisacodyl (BISACODYL) 5 MG EC tablet Take 5 mg by mouth daily as needed for moderate constipation. Dulcolax 5 mg tab take as directed for colonoscopy prep.    . cyclobenzaprine (FLEXERIL) 10 MG tablet Take 1 tablet (10 mg total) at bedtime by mouth. 10 tablet 0  . polyethylene glycol powder (GLYCOLAX/MIRALAX) powder Take 17 g by mouth daily. 3350 g 1  . senna-docusate (SENOKOT-S) 8.6-50 MG tablet Take 1 tablet by mouth 2 (two) times daily. 14 tablet 0  . linaclotide (LINZESS) 145 MCG CAPS capsule Take 1 capsule (145 mcg total) by mouth daily before breakfast. (Patient not taking: Reported on 03/02/2017) 90 capsule 3   No facility-administered medications prior to visit.      ROS Review of Systems  Constitutional: Negative for chills, fever and malaise/fatigue.  Eyes: Negative for blurred vision.  Respiratory: Negative for shortness of breath.   Cardiovascular: Negative for chest pain and palpitations.  Gastrointestinal: Negative for abdominal pain and nausea.  Genitourinary: Negative for dysuria and hematuria.  Musculoskeletal: Positive for myalgias. Negative for joint pain.  Skin:  Negative for rash.  Neurological: Negative for tingling and headaches.  Psychiatric/Behavioral: Negative for depression. The patient is not nervous/anxious.     Objective:  BP 112/65 (BP Location: Left Arm, Patient Position: Sitting, Cuff Size: Large)   Pulse 69   Temp 97.9 F (36.6 C) (Oral)   Resp 18   Ht 5\' 5"  (1.651 m)   Wt 239 lb (108.4 kg)   SpO2 97%   BMI 39.77 kg/m   BP/Weight 05/19/2017 03/02/2017 40/11/1446  Systolic BP 185 631 497  Diastolic BP 65 70 72  Wt. (Lbs) 239 227 231.2  BMI 39.77 37.77 38.47      Physical Exam  Constitutional: He is oriented to person, place, and time.  Well developed, well nourished, NAD, polite  HENT:  Head: Normocephalic and atraumatic.  Eyes: No scleral icterus.  Neck: Normal range of motion. Neck supple. No thyromegaly present.  Cardiovascular: Normal rate, regular rhythm and normal heart sounds.  Pulmonary/Chest: Effort normal and breath sounds normal.  Abdominal: Soft. Bowel sounds are normal. There is no tenderness.  Musculoskeletal: He exhibits no edema.  Left flank and obliques with mildly increased muscular tonicity.  Neurological: He is alert and oriented to person, place, and time. No cranial nerve deficit. Coordination normal.  Skin: Skin is warm and dry. No rash noted. No erythema. No pallor.  Psychiatric: He has a normal mood and affect. His behavior is normal. Thought content normal.  Vitals reviewed.    Assessment & Plan:     1. Muscle strain - Begin methocarbamol (  ROBAXIN) 500 MG tablet; Take 1 tablet (500 mg total) by mouth 3 (three) times daily.  Dispense: 30 tablet; Refill: 0 - Begin acetaminophen (TYLENOL) 500 MG tablet; Take 2 tablets (1,000 mg total) by mouth every 8 (eight) hours as needed for up to 7 days.  Dispense: 21 tablet; Refill: 0     Meds ordered this encounter  Medications  . methocarbamol (ROBAXIN) 500 MG tablet    Sig: Take 1 tablet (500 mg total) by mouth 3 (three) times daily.     Dispense:  30 tablet    Refill:  0    Order Specific Question:   Supervising Provider    Answer:   Tresa Garter [id number]  . acetaminophen (TYLENOL) 500 MG tablet    Sig: Take 2 tablets (1,000 mg total) by mouth every 8 (eight) hours as needed for up to 7 days.    Dispense:  21 tablet    Refill:  0    Order Specific Question:   Supervising Provider    Answer:   Tresa Garter [id number]    Follow-up: Return if symptoms worsen or fail to improve.   Clent Demark PA

## 2017-05-20 ENCOUNTER — Telehealth: Payer: Self-pay | Admitting: Physician Assistant

## 2017-05-20 NOTE — Telephone Encounter (Signed)
I received a  Message that this patient wanted to know the status of his application for financial assistance.  Pt was denied CAFA by the hospital effective 02/27/17 to 08/27/17.  Pt can reapply for CAFA after denial period ends.  I entered [place] for patient on 05/07/17 and mailed the card to his home address.  Was not able to leave a message on his phone (just rang).

## 2017-06-05 DIAGNOSIS — K5900 Constipation, unspecified: Secondary | ICD-10-CM

## 2017-06-30 ENCOUNTER — Other Ambulatory Visit: Payer: Self-pay

## 2017-06-30 ENCOUNTER — Ambulatory Visit (INDEPENDENT_AMBULATORY_CARE_PROVIDER_SITE_OTHER): Payer: Self-pay | Admitting: Physician Assistant

## 2017-06-30 ENCOUNTER — Encounter (INDEPENDENT_AMBULATORY_CARE_PROVIDER_SITE_OTHER): Payer: Self-pay | Admitting: Physician Assistant

## 2017-06-30 VITALS — BP 127/83 | HR 66 | Temp 97.8°F | Wt 238.8 lb

## 2017-06-30 DIAGNOSIS — R1033 Periumbilical pain: Secondary | ICD-10-CM

## 2017-06-30 DIAGNOSIS — G8929 Other chronic pain: Secondary | ICD-10-CM

## 2017-06-30 DIAGNOSIS — M25512 Pain in left shoulder: Secondary | ICD-10-CM

## 2017-06-30 DIAGNOSIS — M25561 Pain in right knee: Secondary | ICD-10-CM

## 2017-06-30 DIAGNOSIS — K5900 Constipation, unspecified: Secondary | ICD-10-CM

## 2017-06-30 MED ORDER — LINACLOTIDE 145 MCG PO CAPS: 145.0000 ug | ORAL_CAPSULE | Freq: Every day | ORAL | 3 refills | Status: DC

## 2017-06-30 MED ORDER — ACETAMINOPHEN-CODEINE #3 300-30 MG PO TABS: 1.0000 | ORAL_TABLET | Freq: Two times a day (BID) | ORAL | 0 refills | Status: AC

## 2017-06-30 NOTE — Progress Notes (Signed)
Subjective:  Patient ID: Gregory Ray, male    DOB: [birth year]  Age: 52 y.o. MRN: [id number]  CC: shoulder  HPI Gregory Ray a 51 y.o.malewith a medical history of arthritis(hands, shoulders), constipation, Marijuana use, constipation, and hemorrhoids presents with pain of the left shoulder, right knee, and abdomen. Joint pains are chronic and has not been able to obtain financial assistance for proper imaging, PT, or orthopedic care. Would like medication to relieve pain. Right knee pain worse at the medial aspect of knee at mid line. Left shoulder with pain on abduction and flexion above 90 degrees.     Also complains of focal abdominal pain located in the right aspect of the umbilical region of abdomen and aggravated when stooping over, lifting heavy items, or generally using abdominal muscles. Can feel a mass in the area of pain. No pain when laying supine. Has not taken anything for relief. No issues with bowel movements. Does not endorse constitutional symptoms or any other associated symptoms.     Outpatient Medications Prior to Visit  Medication Sig Dispense Refill  . linaclotide (LINZESS) 145 MCG CAPS capsule Take 1 capsule (145 mcg total) by mouth daily before breakfast. 90 capsule 3  . methocarbamol (ROBAXIN) 500 MG tablet Take 1 tablet (500 mg total) by mouth 3 (three) times daily. 30 tablet 0  . bisacodyl (BISACODYL) 5 MG EC tablet Take 5 mg by mouth daily as needed for moderate constipation. Dulcolax 5 mg tab take as directed for colonoscopy prep.    . cyclobenzaprine (FLEXERIL) 10 MG tablet Take 1 tablet (10 mg total) at bedtime by mouth. (Patient not taking: Reported on 06/30/2017) 10 tablet 0  . polyethylene glycol powder (GLYCOLAX/MIRALAX) powder Take 17 g by mouth daily. (Patient not taking: Reported on 06/30/2017) 3350 g 1  . senna-docusate (SENOKOT-S) 8.6-50 MG tablet Take 1 tablet by mouth 2 (two) times daily. (Patient not taking: Reported on 06/30/2017) 14 tablet 0    No facility-administered medications prior to visit.      ROS Review of Systems  Constitutional: Negative for chills, fever and malaise/fatigue.  Eyes: Negative for blurred vision.  Respiratory: Negative for shortness of breath.   Cardiovascular: Negative for chest pain and palpitations.  Gastrointestinal: Positive for abdominal pain and constipation. Negative for nausea.  Genitourinary: Negative for dysuria and hematuria.  Musculoskeletal: Positive for joint pain. Negative for myalgias.  Skin: Negative for rash.  Neurological: Negative for tingling and headaches.  Psychiatric/Behavioral: Negative for depression. The patient is not nervous/anxious.     Objective:  BP 127/83 (BP Location: Right Arm, Patient Position: Sitting, Cuff Size: Large)   Pulse 66   Temp 97.8 F (36.6 C) (Oral)   Wt 238 lb 12.8 oz (108.3 kg)   SpO2 95%   BMI 39.74 kg/m   BP/Weight 06/30/2017 05/19/2017 38/18/2993  Systolic BP 716 967 893  Diastolic BP 83 65 70  Wt. (Lbs) 238.8 239 227  BMI 39.74 39.77 37.77      Physical Exam  Constitutional: He is oriented to person, place, and time.  Well developed, obese, NAD, polite  HENT:  Head: Normocephalic and atraumatic.  Eyes: No scleral icterus.  Neck: Normal range of motion. Neck supple. No thyromegaly present.  Cardiovascular: Normal rate, regular rhythm and normal heart sounds.  Pulmonary/Chest: Effort normal and breath sounds normal.  Abdominal: Soft. Bowel sounds are normal. There is tenderness (point tenderness in the right aspect of umbilical region of the abdomen ).  Musculoskeletal: He exhibits  no edema.  Thessaley possitive on right side. Mid line joint tenderness to the medial aspect of right knee; negative anterior/posterior, patellar apprehension, and varus/valgus stress testing.  Left shoulder with positive AC shear testing, O'Brien's, Cross body, and internal/external rotation resistance.  Neurological: He is alert and oriented to  person, place, and time.  Skin: Skin is warm and dry. No rash noted. No erythema. No pallor.  Psychiatric: He has a normal mood and affect. His behavior is normal. Thought content normal.  Vitals reviewed.    Assessment & Plan:   1. Chronic left shoulder pain - acetaminophen-codeine (TYLENOL #3) 300-30 MG tablet; Take 1 tablet by mouth every 12 (twelve) hours for 21 days.  Dispense: 42 tablet; Refill: 0  2. Chronic pain of right knee - acetaminophen-codeine (TYLENOL #3) 300-30 MG tablet; Take 1 tablet by mouth every 12 (twelve) hours for 21 days.  Dispense: 42 tablet; Refill: 0  3. Periumbilical abdominal pain - Suspected reducible hernia. Will need [place] and possible surgical evaluation once financial assistance is approved.   4. Constipation, unspecified constipation type - linaclotide (LINZESS) 145 MCG CAPS capsule; Take 1 capsule (145 mcg total) by mouth daily before breakfast.  Dispense: 90 capsule; Refill: 3   Meds ordered this encounter  Medications  . acetaminophen-codeine (TYLENOL #3) 300-30 MG tablet    Sig: Take 1 tablet by mouth every 12 (twelve) hours for 21 days.    Dispense:  42 tablet    Refill:  0    Order Specific Question:   Supervising Provider    Answer:   Tresa Garter [id number]  . linaclotide (LINZESS) 145 MCG CAPS capsule    Sig: Take 1 capsule (145 mcg total) by mouth daily before breakfast.    Dispense:  90 capsule    Refill:  3    Order Specific Question:   Supervising Provider    Answer:   Tresa Garter [[id number]]    Follow-up: Return in about 4 weeks (around 07/28/2017) for knee and shoulder pain.   Clent Demark PA

## 2017-06-30 NOTE — Patient Instructions (Signed)
Hernia A hernia happens when an organ or tissue inside your body pushes out through a weak spot in the belly (abdomen). Follow these instructions at home:  Avoid stretching or overusing (straining) the muscles near the hernia.  Do not lift anything heavier than 10 lb (4.5 kg).  Use the muscles in your leg when you lift something up. Do not use the muscles in your back.  When you cough, try to cough gently.  Eat a diet that has a lot of fiber. Eat lots of fruits and vegetables.  Drink enough fluids to keep your pee (urine) clear or pale yellow. Try to drink 6-8 glasses of water a day.  Take medicines to make your poop soft (stool softeners) as told by your doctor.  Lose weight, if you are overweight.  Do not use any tobacco products, including cigarettes, chewing tobacco, or electronic cigarettes. If you need help quitting, ask your doctor.  Keep all follow-up visits as told by your doctor. This is important. Contact a doctor if:  The skin by the hernia gets puffy (swollen) or red.  The hernia is painful. Get help right away if:  You have a fever.  You have belly pain that is getting worse.  You feel sick to your stomach (nauseous) or you throw up (vomit).  You cannot push the hernia back in place by gently pressing on it while you are lying down.  The hernia: ? Changes in shape or size. ? Is stuck outside your belly. ? Changes color. ? Feels hard or tender. This information is not intended to replace advice given to you by your health care provider. Make sure you discuss any questions you have with your health care provider. Document Released: 09/25/2009 Document Revised: 09/13/2015 Document Reviewed: 02/15/2014 Elsevier Interactive Patient Education  2018 Elsevier Inc.  

## 2017-07-08 ENCOUNTER — Other Ambulatory Visit (INDEPENDENT_AMBULATORY_CARE_PROVIDER_SITE_OTHER): Payer: Self-pay | Admitting: Physician Assistant

## 2017-07-08 DIAGNOSIS — M25512 Pain in left shoulder: Principal | ICD-10-CM

## 2017-07-08 DIAGNOSIS — G8929 Other chronic pain: Secondary | ICD-10-CM

## 2017-07-08 DIAGNOSIS — K5900 Constipation, unspecified: Secondary | ICD-10-CM

## 2017-07-08 DIAGNOSIS — M25561 Pain in right knee: Secondary | ICD-10-CM

## 2017-07-08 NOTE — Telephone Encounter (Signed)
FWD to PCP. Tempestt S Roberts, CMA  

## 2017-07-16 ENCOUNTER — Telehealth (INDEPENDENT_AMBULATORY_CARE_PROVIDER_SITE_OTHER): Payer: Self-pay | Admitting: Physician Assistant

## 2017-07-16 ENCOUNTER — Other Ambulatory Visit (INDEPENDENT_AMBULATORY_CARE_PROVIDER_SITE_OTHER): Payer: Self-pay | Admitting: Physician Assistant

## 2017-07-16 NOTE — Telephone Encounter (Signed)
Pt called to request a referral for an eye doctor and  A dentist, pt was inform that the dental office is close for now and as soon is open we will let him know, please follow up

## 2017-07-16 NOTE — Telephone Encounter (Signed)
I have reviewed his chart and I have seen where he is complaining of the eye/eyes. Please ask him what specifically is wrong with him. This will help guiding my answer to him, and of course with our efficiency. Thank you for letting him know of the dental referral issue.

## 2017-07-17 ENCOUNTER — Other Ambulatory Visit (INDEPENDENT_AMBULATORY_CARE_PROVIDER_SITE_OTHER): Payer: Self-pay | Admitting: Physician Assistant

## 2017-07-17 DIAGNOSIS — H538 Other visual disturbances: Secondary | ICD-10-CM

## 2017-07-17 NOTE — Telephone Encounter (Signed)
I spoke with the Pt, he claim that he has blurred vision all the time day and night, that he does not wear glasses but he would like to be check in case he need glasses, please follow up

## 2017-07-17 NOTE — Telephone Encounter (Signed)
I meant to say "I have reviewed his chart and I have not seen....." . Regardless, I will send to ophthalmology.

## 2017-07-28 ENCOUNTER — Encounter (INDEPENDENT_AMBULATORY_CARE_PROVIDER_SITE_OTHER): Payer: Self-pay | Admitting: Physician Assistant

## 2017-07-28 ENCOUNTER — Ambulatory Visit (INDEPENDENT_AMBULATORY_CARE_PROVIDER_SITE_OTHER): Payer: Self-pay | Admitting: Physician Assistant

## 2017-07-28 ENCOUNTER — Other Ambulatory Visit: Payer: Self-pay

## 2017-07-28 VITALS — BP 120/82 | HR 59 | Temp 97.7°F | Ht 65.0 in | Wt 237.0 lb

## 2017-07-28 DIAGNOSIS — G8929 Other chronic pain: Secondary | ICD-10-CM

## 2017-07-28 DIAGNOSIS — M25512 Pain in left shoulder: Secondary | ICD-10-CM

## 2017-07-28 DIAGNOSIS — M25561 Pain in right knee: Secondary | ICD-10-CM

## 2017-07-28 DIAGNOSIS — T148XXA Other injury of unspecified body region, initial encounter: Secondary | ICD-10-CM

## 2017-07-28 DIAGNOSIS — K5900 Constipation, unspecified: Secondary | ICD-10-CM

## 2017-07-28 MED ORDER — ACETAMINOPHEN-CODEINE #3 300-30 MG PO TABS: 1.0000 | ORAL_TABLET | Freq: Three times a day (TID) | ORAL | 0 refills | Status: AC | PRN

## 2017-07-28 MED ORDER — CELECOXIB 200 MG PO CAPS: 200.0000 mg | ORAL_CAPSULE | Freq: Two times a day (BID) | ORAL | 3 refills | Status: DC

## 2017-07-28 MED ORDER — METHOCARBAMOL 500 MG PO TABS: 500.0000 mg | ORAL_TABLET | Freq: Three times a day (TID) | ORAL | 0 refills | Status: DC

## 2017-07-28 MED FILL — CELECOXIB 200 MG CAP: 200 | 30 days supply | Qty: 60 | Fill #0

## 2017-07-28 MED FILL — METHOCARBAMOL 500 MG TABLET: 500 | 10 days supply | Qty: 30 | Fill #0

## 2017-07-28 NOTE — Progress Notes (Signed)
Subjective:  Patient ID: Gregory Ray, male    DOB: [birth year]  Age: 52 y.o. MRN: [id number]  CC: f/u knee and shoulder pain  HPI Gregory Ray a 51 y.o.malewith a medical history of arthritis(hands, shoulders), constipation, Marijuana use, constipation, and hemorrhoids presentswith pain of the left shoulder and right knee. Left shoulder is still "about the same". Right knee is less swollen but the same in regards to pain. Still in the process of applying for CAFA.     Says he has right sided neck pain due to sleeping on a "bad pillow". Says wife changed the pillows to softer pillows. No paresthesia, headache, RUE weakness/paralysis. No CP, palpitations, SOB, abdominal pain, f/c/n/v, rash, or GI/GU sxs. Constipation is resolving since a change in diet was implemented. Eats more vegetables and lean meats.   Outpatient Medications Prior to Visit  Medication Sig Dispense Refill  . bisacodyl (BISACODYL) 5 MG EC tablet Take 5 mg by mouth daily as needed for moderate constipation. Dulcolax 5 mg tab take as directed for colonoscopy prep.    Marland Kitchen linaclotide (LINZESS) 145 MCG CAPS capsule Take 1 capsule (145 mcg total) by mouth daily before breakfast. (Patient not taking: Reported on 07/28/2017) 90 capsule 3  . LINZESS 145 MCG CAPS capsule TAKE 1 CAPSULE (145 MCG TOTAL) BY MOUTH DAILY BEFORE BREAKFAST. (Patient not taking: Reported on 07/28/2017) 30 capsule 11  . methocarbamol (ROBAXIN) 500 MG tablet Take 1 tablet (500 mg total) by mouth 3 (three) times daily. (Patient not taking: Reported on 07/28/2017) 30 tablet 0  . polyethylene glycol powder (GLYCOLAX/MIRALAX) powder Take 17 g by mouth daily. (Patient not taking: Reported on 06/30/2017) 3350 g 1  . senna-docusate (SENOKOT-S) 8.6-50 MG tablet Take 1 tablet by mouth 2 (two) times daily. (Patient not taking: Reported on 06/30/2017) 14 tablet 0   No facility-administered medications prior to visit.      ROS Review of Systems  Constitutional:  Negative for chills, fever and malaise/fatigue.  Eyes: Negative for blurred vision.  Respiratory: Negative for shortness of breath.   Cardiovascular: Negative for chest pain and palpitations.  Gastrointestinal: Negative for abdominal pain and nausea.  Genitourinary: Negative for dysuria and hematuria.  Musculoskeletal: Positive for joint pain and myalgias.  Skin: Negative for rash.  Neurological: Negative for tingling and headaches.  Psychiatric/Behavioral: Negative for depression. The patient is not nervous/anxious.     Objective:  BP 120/82 (BP Location: Left Arm, Patient Position: Sitting, Cuff Size: Large)   Pulse (!) 59   Temp 97.7 F (36.5 C) (Oral)   Ht 5\' 5"  (1.651 m)   Wt 237 lb (107.5 kg)   SpO2 91%   BMI 39.44 kg/m   BP/Weight 07/28/2017 06/30/2017 9/41/7408  Systolic BP 144 818 563  Diastolic BP 82 83 65  Wt. (Lbs) 237 238.8 239  BMI 39.44 39.74 39.77      Physical Exam  Constitutional: He is oriented to person, place, and time.  Well developed, well nourished, NAD, polite  HENT:  Head: Normocephalic and atraumatic.  Eyes: No scleral icterus.  Neck:  Limited right rotation of neck at approximately 45 degrees 2/2 pain. Full pROM of neck.  Cardiovascular: Normal rate, regular rhythm and normal heart sounds.  Pulmonary/Chest: Effort normal and breath sounds normal.  Musculoskeletal:  Thessaley possitive on right side. Mid line joint tenderness to the medial aspect of right knee; negative anterior/posterior, patellar apprehension, and varus/valgus stress testing.  Left shoulder with positive AC shear testing, O'Brien's, Cross body,  and internal/external rotation resistance. Mildly increased muscular tonicity of the right upper trapezius.   Neurological: He is alert and oriented to person, place, and time.  Skin: Skin is warm and dry. No rash noted. No erythema. No pallor.  Psychiatric: He has a normal mood and affect. His behavior is normal. Thought content normal.   Vitals reviewed.    Assessment & Plan:   1. Muscle strain - methocarbamol (ROBAXIN) 500 MG tablet; Take 1 tablet (500 mg total) by mouth 3 (three) times daily.  Dispense: 30 tablet; Refill: 0  2. Chronic left shoulder pain - Suspect impingement vs AC joint tendonitis - celecoxib (CELEBREX) 200 MG capsule; Take 1 capsule (200 mg total) by mouth 2 (two) times daily.  Dispense: 60 capsule; Refill: 3 - refill Tylenol #3, one tab po q8hrs x7 days, #21  3. Chronic pain of right knee - Suspected meniscal injury - celecoxib (CELEBREX) 200 MG capsule; Take 1 capsule (200 mg total) by mouth 2 (two) times daily.  Dispense: 60 capsule; Refill: 3 - refill Tylenol #3, one tab po q8hrs x7 days, #21  4. Constipation, unspecified constipation type - Resolving with dietary changes   Meds ordered this encounter  Medications  . methocarbamol (ROBAXIN) 500 MG tablet    Sig: Take 1 tablet (500 mg total) by mouth 3 (three) times daily.    Dispense:  30 tablet    Refill:  0    Order Specific Question:   Supervising Provider    Answer:   Tresa Garter [id number]  . celecoxib (CELEBREX) 200 MG capsule    Sig: Take 1 capsule (200 mg total) by mouth 2 (two) times daily.    Dispense:  60 capsule    Refill:  3    Order Specific Question:   Supervising Provider    Answer:   Tresa Garter [id number]  . acetaminophen-codeine (TYLENOL #3) 300-30 MG tablet    Sig: Take 1 tablet by mouth every 8 (eight) hours as needed for up to 7 days for moderate pain.    Dispense:  21 tablet    Refill:  0    Order Specific Question:   Supervising Provider    Answer:   Tresa Garter [id number]    Follow-up: Return in about 4 years (around 07/28/2021), or if symptoms worsen or fail to improve, for joint pains.   Clent Demark PA

## 2017-07-28 NOTE — Patient Instructions (Signed)
Muscle Strain A muscle strain (pulled muscle) happens when a muscle is stretched beyond normal length. It happens when a sudden, violent force stretches your muscle too far. Usually, a few of the fibers in your muscle are torn. Muscle strain is common in athletes. Recovery usually takes 1-2 weeks. Complete healing takes 5-6 weeks. Follow these instructions at home:  Follow the PRICE method of treatment to help your injury get better. Do this the first 2-3 days after the injury: ? Protect. Protect the muscle to keep it from getting injured again. ? Rest. Limit your activity and rest the injured body part. ? Ice. Put ice in a plastic bag. Place a towel between your skin and the bag. Then, apply the ice and leave it on from 15-20 minutes each hour. After the third day, switch to moist heat packs. ? Compression. Use a splint or elastic bandage on the injured area for comfort. Do not put it on too tightly. ? Elevate. Keep the injured body part above the level of your heart.  Only take medicine as told by your doctor.  Warm up before doing exercise to prevent future muscle strains. Contact a doctor if:  You have more pain or puffiness (swelling) in the injured area.  You feel numbness, tingling, or notice a loss of strength in the injured area. This information is not intended to replace advice given to you by your health care provider. Make sure you discuss any questions you have with your health care provider. Document Released: 01/15/2008 Document Revised: 09/13/2015 Document Reviewed: 11/04/2012 Elsevier Interactive Patient Education  2017 Elsevier Inc.  

## 2017-09-03 ENCOUNTER — Emergency Department (HOSPITAL_COMMUNITY): Payer: Self-pay

## 2017-09-03 ENCOUNTER — Emergency Department (HOSPITAL_COMMUNITY)
Admission: EM | Admit: 2017-09-03 | Discharge: 2017-09-03 | Disposition: A | Discharge status: Home / Self Care | Payer: Self-pay | Attending: Emergency Medicine | Admitting: Emergency Medicine

## 2017-09-03 ENCOUNTER — Encounter (HOSPITAL_COMMUNITY): Payer: Self-pay | Admitting: Emergency Medicine

## 2017-09-03 ENCOUNTER — Other Ambulatory Visit: Payer: Self-pay

## 2017-09-03 DIAGNOSIS — J36 Peritonsillar abscess: Secondary | ICD-10-CM | POA: Insufficient documentation

## 2017-09-03 DIAGNOSIS — F17210 Nicotine dependence, cigarettes, uncomplicated: Secondary | ICD-10-CM | POA: Insufficient documentation

## 2017-09-03 LAB — BASIC METABOLIC PANEL
Anion gap: 8 (ref 5–15)
BUN: 13 mg/dL (ref 6–20)
CO2: 27 mmol/L (ref 22–32)
CREATININE: 1.3 mg/dL — AB (ref 0.61–1.24)
Calcium: 8.6 mg/dL — ABNORMAL LOW (ref 8.9–10.3)
Chloride: 106 mmol/L (ref 101–111)
GFR calc Af Amer: >60 mL/min (ref >60)
GLUCOSE: 111 mg/dL — AB (ref 65–99)
POTASSIUM: 3.8 mmol/L (ref 3.5–5.1)
SODIUM: 141 mmol/L (ref 135–145)

## 2017-09-03 LAB — CBC WITH DIFFERENTIAL/PLATELET
Basophils Absolute: 0 10*3/uL (ref 0.0–0.1)
Basophils Relative: 0 %
EOS ABS: 0.1 10*3/uL (ref 0.0–0.7)
EOS PCT: 1 %
HCT: 42.6 % (ref 39.0–52.0)
Hemoglobin: 14.4 g/dL (ref 13.0–17.0)
LYMPHS ABS: 1.1 10*3/uL (ref 0.7–4.0)
LYMPHS PCT: 12 %
MCH: 32.6 pg (ref 26.0–34.0)
MCHC: 33.8 g/dL (ref 30.0–36.0)
MCV: 96.4 fL (ref 78.0–100.0)
MONO ABS: 1 10*3/uL (ref 0.1–1.0)
Monocytes Relative: 11 %
Neutro Abs: 6.7 10*3/uL (ref 1.7–7.7)
Neutrophils Relative %: 76 %
PLATELETS: 193 10*3/uL (ref 150–400)
RBC: 4.42 MIL/uL (ref 4.22–5.81)
RDW: 14.4 % (ref 11.5–15.5)
WBC: 8.8 10*3/uL (ref 4.0–10.5)

## 2017-09-03 LAB — GROUP A STREP BY PCR: Group A Strep by PCR: NOT DETECTED

## 2017-09-03 MED ORDER — IBUPROFEN 600 MG PO TABS: 600.0000 mg | ORAL_TABLET | Freq: Four times a day (QID) | ORAL | 0 refills | Status: DC | PRN

## 2017-09-03 MED ORDER — DEXAMETHASONE SODIUM PHOSPHATE 10 MG/ML IJ SOLN
10.0000 mg | Freq: Once | INTRAMUSCULAR | Status: AC
  Administered 2017-09-03: 10 mg via INTRAVENOUS
  Filled 2017-09-03: qty 1

## 2017-09-03 MED ORDER — CLINDAMYCIN HCL 300 MG PO CAPS: 300.0000 mg | ORAL_CAPSULE | Freq: Four times a day (QID) | ORAL | 0 refills | Status: DC

## 2017-09-03 MED ORDER — IOHEXOL 300 MG/ML  SOLN
75.0000 mL | Freq: Once | INTRAMUSCULAR | Status: AC | PRN
  Administered 2017-09-03: 75 mL via INTRAVENOUS

## 2017-09-03 MED ORDER — CLINDAMYCIN HCL 300 MG PO CAPS: 300.0000 mg | ORAL_CAPSULE | Freq: Four times a day (QID) | ORAL | 0 refills | Status: AC

## 2017-09-03 MED ORDER — HYDROCODONE-ACETAMINOPHEN 5-325 MG PO TABS: 1.0000 | ORAL_TABLET | ORAL | 0 refills | Status: DC | PRN

## 2017-09-03 MED ORDER — KETOROLAC TROMETHAMINE 30 MG/ML IJ SOLN
30.0000 mg | Freq: Once | INTRAMUSCULAR | Status: AC
  Administered 2017-09-03: 30 mg via INTRAVENOUS
  Filled 2017-09-03: qty 1

## 2017-09-03 MED ORDER — CLINDAMYCIN PHOSPHATE 600 MG/50ML IV SOLN
600.0000 mg | Freq: Once | INTRAVENOUS | Status: AC
  Administered 2017-09-03: 600 mg via INTRAVENOUS
  Filled 2017-09-03: qty 50

## 2017-09-03 MED ORDER — SODIUM CHLORIDE 0.9 % IV BOLUS
1000.0000 mL | Freq: Once | INTRAVENOUS | Status: AC
  Administered 2017-09-03: 1000 mL via INTRAVENOUS

## 2017-09-03 MED FILL — IBUPROFEN 600 MG TABLET: 600 | 7 days supply | Qty: 30 | Fill #0

## 2017-09-03 MED FILL — CLINDAMYCIN HCL 300 MG CAP: 300 | 14 days supply | Qty: 56 | Fill #0

## 2017-09-03 NOTE — ED Provider Notes (Signed)
Patient presents with findings consistent with peritonsillar abscess.  Patient accepted at sign out for review CT scan and final disposition.  CT scan confirms peritonsillar abscess.  Patient does not have a leukocytosis.  I have reassessed the patient.  He reports he feels much improved.  He is nontoxic and alert. Physical Exam  BP 129/89   Pulse 63   Temp 99 F (37.2 C) (Oral)   Resp 16   Ht 5\' 5"  (1.651 m)   Wt 104.3 kg (230 lb)   SpO2 95%   BMI 38.27 kg/m   Physical Exam  Constitutional: He is oriented to person, place, and time.  Patient is alert and appropriate without respiratory distress.  HENT:  Patient has moderate fullness with slight uvular displacement on the right side.  This is consistent with peritonsillar abscess.  Posterior oropharynx however is widely patent without signs of obstruction.  No trismus.  Eyes: EOM are normal.  Pulmonary/Chest: Effort normal.  Neurological: He is alert and oriented to person, place, and time. Coordination normal.  Psychiatric: He has a normal mood and affect.    ED Course/Procedures     Procedures  MDM  Has stable airway.  He has confirmed peritonsillar abscess.  Patient feels significantly improved provided treatment.  Airway is patent without signs of any imminent obstruction or compromise.  Will initiate outpatient clindamycin with ibuprofen and Vicodin for pain.  Patient is counseled on follow-up with ENT.  Return precautions reviewed.       Charlesetta Shanks, MD 09/03/17 [phone]

## 2017-09-03 NOTE — ED Provider Notes (Signed)
TIME SEEN: 6:23 AM  CHIEF COMPLAINT: Sore throat  HPI: Patient is a 52 year old male with no significant past medical history who presents to the emergency department sore throat for the past couple of days.  He is unaware of any fever.  Has had right ear pain as well.  Having difficulty swallowing his secretions secondary to pain but no changes in his voice and no difficulty breathing.  He  ROS: See HPI Constitutional: no fever  Eyes: no drainage  ENT: no runny nose   Cardiovascular:  no chest pain  Resp: no SOB  GI: no vomiting GU: no dysuria Integumentary: no rash  Allergy: no hives  Musculoskeletal: no leg swelling  Neurological: no slurred speech ROS otherwise negative  PAST MEDICAL HISTORY/PAST SURGICAL HISTORY:  Past Medical History:  Diagnosis Date  . Arthritis    hands, shoulders  . Constipation   . Marijuana user    last use 02/18/17 - 2to 3 blunts daily    MEDICATIONS:  Prior to Admission medications   Medication Sig Start Date End Date Taking? Authorizing Provider  celecoxib (CELEBREX) 200 MG capsule Take 1 capsule (200 mg total) by mouth 2 (two) times daily. Patient not taking: Reported on 09/03/2017 07/28/17   Clent Demark, PA-C  methocarbamol (ROBAXIN) 500 MG tablet Take 1 tablet (500 mg total) by mouth 3 (three) times daily. Patient not taking: Reported on 09/03/2017 07/28/17   Clent Demark, PA-C    ALLERGIES:  Allergies  Allergen Reactions  . Ibuprofen     Irritates stomach    SOCIAL HISTORY:  Social History   Tobacco Use  . Smoking status: Current Every Day Smoker    Packs/day: 0.25    Years: 32.00    Pack years: 8.00    Types: Cigarettes  . Smokeless tobacco: Never Used  . Tobacco comment: 1 cig a day  Substance Use Topics  . Alcohol use: Yes    Alcohol/week: 3.6 oz    Types: 6 Cans of beer per week    FAMILY HISTORY: Family History  Problem Relation Age of Onset  . Stomach cancer Brother   . Colon cancer Neg Hx   . Colon  polyps Neg Hx   . Rectal cancer Neg Hx     EXAM: BP (!) 145/90 (BP Location: Left Arm)   Pulse 70   Temp 99 F (37.2 C) (Oral)   Resp 18   Ht 5\' 5"  (1.651 m)   Wt 104.3 kg (230 lb)   SpO2 95%   BMI 38.27 kg/m  CONSTITUTIONAL: Alert and oriented and responds appropriately to questions. Well-appearing; well-nourished HEAD: Normocephalic EYES: Conjunctivae clear, pupils appear equal, EOMI ENT: normal nose; moist mucous membranes; TMs are clear bilaterally without erythema, purulence, bulging, perforation, effusion.  No cerumen impaction or sign of foreign body in the external auditory canal. No inflammation, erythema or drainage from the external auditory canal. No signs of mastoiditis. No pain with manipulation of the pinna bilaterally.  He does have significant amount of cerumen in both external auditory canals but no impaction.  Clinically patient appears to have a right-sided peritonsillar abscess with fullness around the right tonsil and shift of the uvula to the left.  He has some pain with opening his mouth but no trismus.  He is spinning intermittently.  No change in his voice.  No angioedema.  No stridor. NECK: Supple, no meningismus, no nuchal rigidity, no LAD  CARD: RRR; S1 and S2 appreciated; no murmurs, no clicks, no  rubs, no gallops RESP: Normal chest excursion without splinting or tachypnea; breath sounds clear and equal bilaterally; no wheezes, no rhonchi, no rales, no hypoxia or respiratory distress, speaking full sentences ABD/GI: Normal bowel sounds; non-distended; soft, non-tender, no rebound, no guarding, no peritoneal signs, no hepatosplenomegaly BACK:  The back appears normal and is non-tender to palpation, there is no CVA tenderness EXT: Normal ROM in all joints; non-tender to palpation; no edema; normal capillary refill; no cyanosis, no calf tenderness or swelling    SKIN: Normal color for age and race; warm; no rash NEURO: Moves all extremities equally PSYCH: The  patient's mood and manner are appropriate. Grooming and personal hygiene are appropriate.  MEDICAL DECISION MAKING: Patient here clinically with right-sided PTA.  Will treat symptomatically with IV fluids, Decadron, clindamycin, Toradol.  Strep test, labs pending.  Will discuss with ENT on-call.  ED PROGRESS: Dr. Erik Obey paged at 3:32 AM.  Have not received call back.  Pt's labs, strep test, CT pending.  Signed out to oncoming EDP.   I reviewed all nursing notes, vitals, pertinent previous records, EKGs, lab and urine results, imaging (as available).      Analy Bassford, Delice Bison, DO 09/03/17 [phone]

## 2018-03-08 ENCOUNTER — Ambulatory Visit (INDEPENDENT_AMBULATORY_CARE_PROVIDER_SITE_OTHER): Payer: Medicaid Other | Admitting: Physician Assistant

## 2018-03-28 IMAGING — DX DG ABDOMEN 1V
1 series · 1 of 1 positions shown · non-contrast
Comparison: Abdominal and pelvic CT scan January 29, 2011

CLINICAL DATA: One 2 weeks of constipation. Persistent left lower
quadrant pain despite taking medication and having a bowel movement
this morning.

EXAM:
ABDOMEN - 1 VIEW

[abdomen kub]
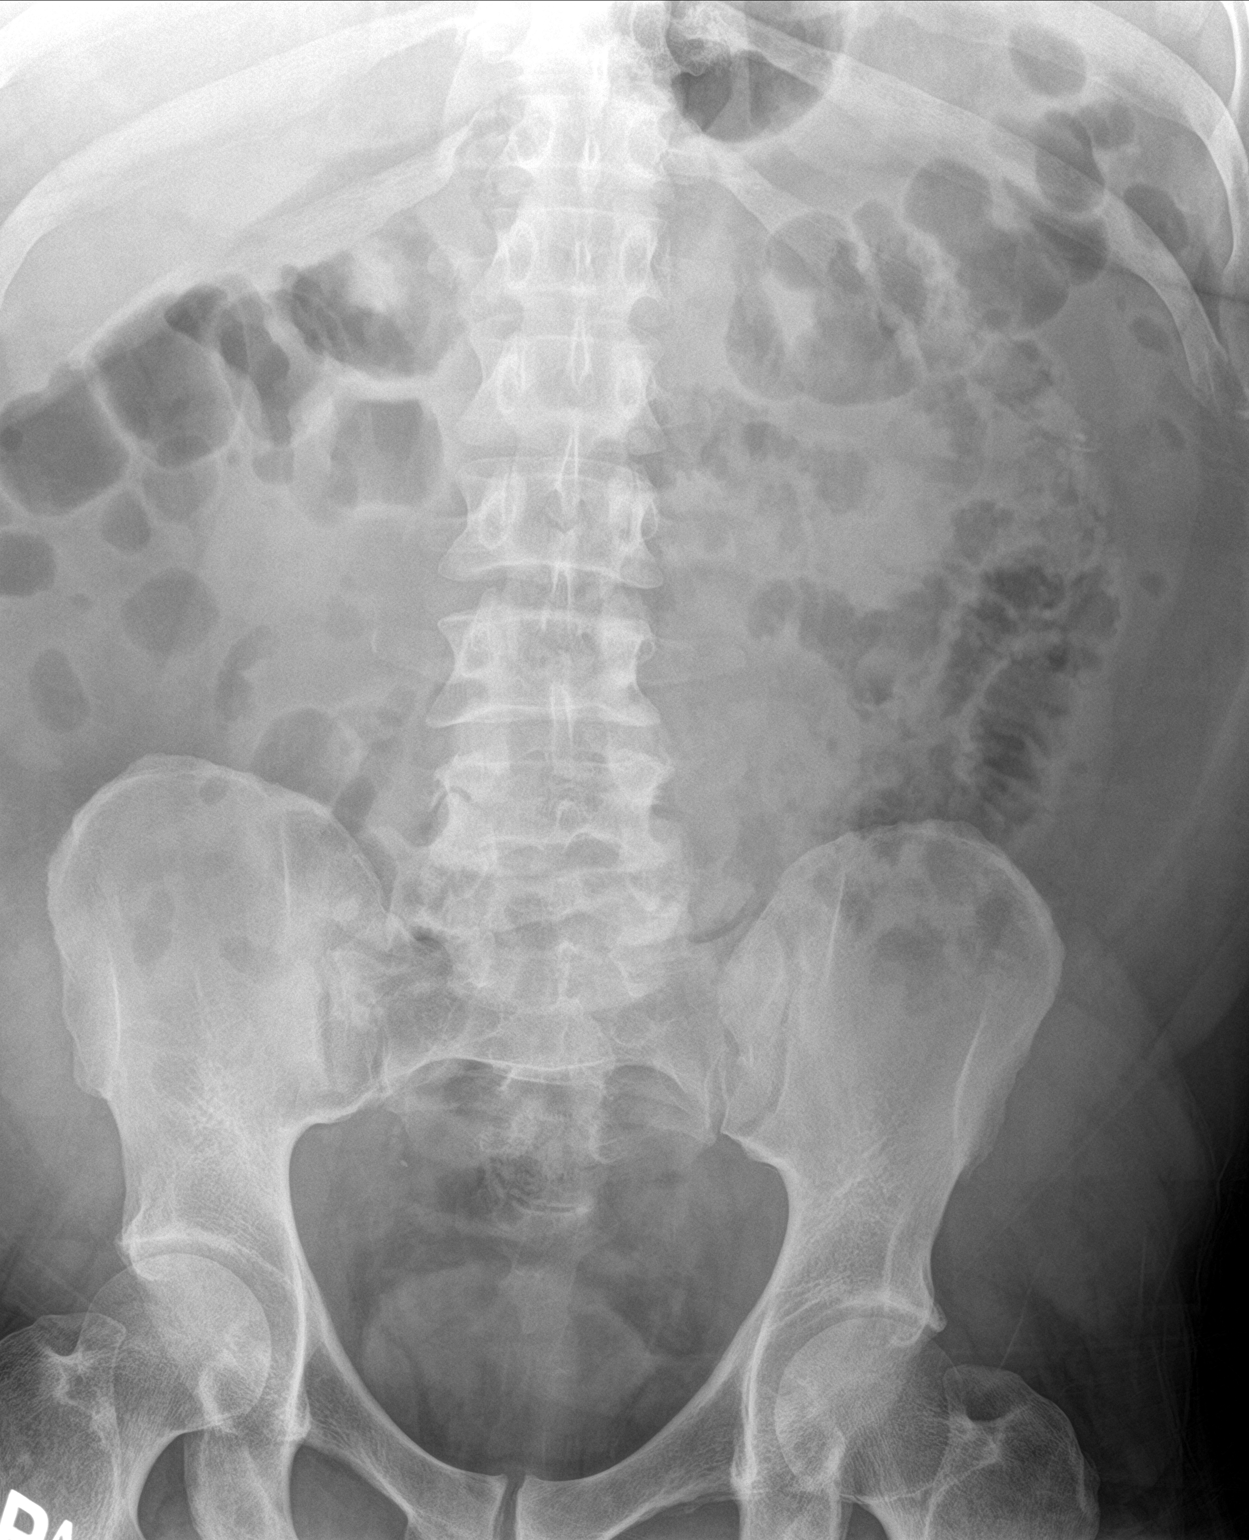

[1 of 1 positions shown; findings below may reference images not displayed]

FINDINGS: The colonic stool burden does not appear excessive. There is gas
within loops of minimally distended small bowel to the left of
midline. There are no abnormal soft tissue calcifications. The bony
structures are unremarkable.
IMPRESSION: The colonic stool burden does not appear increased. The bowel gas
pattern is relatively nonspecific. Further evaluation of the abdomen
with abdominal and pelvic CT scanning would be useful to exclude
entities such as inflammatory bowel disease or diverticulitis.

## 2018-03-29 ENCOUNTER — Other Ambulatory Visit: Payer: Self-pay

## 2018-03-29 ENCOUNTER — Ambulatory Visit (INDEPENDENT_AMBULATORY_CARE_PROVIDER_SITE_OTHER): Payer: Self-pay | Admitting: Physician Assistant

## 2018-03-29 ENCOUNTER — Encounter (INDEPENDENT_AMBULATORY_CARE_PROVIDER_SITE_OTHER): Payer: Self-pay | Admitting: Physician Assistant

## 2018-03-29 VITALS — BP 123/75 | HR 70 | Temp 98.0°F | Ht 65.0 in | Wt 244.4 lb

## 2018-03-29 DIAGNOSIS — Z23 Encounter for immunization: Secondary | ICD-10-CM

## 2018-03-29 DIAGNOSIS — R3911 Hesitancy of micturition: Secondary | ICD-10-CM

## 2018-03-29 DIAGNOSIS — N529 Male erectile dysfunction, unspecified: Secondary | ICD-10-CM

## 2018-03-29 MED ORDER — SILDENAFIL CITRATE 20 MG PO TABS: ORAL_TABLET | ORAL | 2 refills | Status: DC

## 2018-03-29 MED FILL — SILDENAFIL CITRATE 20 MG TA: 20 | 30 days supply | Qty: 10 | Fill #0

## 2018-03-29 NOTE — Progress Notes (Signed)
Subjective:  Patient ID: Gregory Ray, male    DOB: 04-Jun-1965  Age: 52 y.o. MRN: [id number]  CC: f/u ED  HPI KAROL LIENDO a 51 y.o.malewith a medical history of arthritis(hands, shoulders), constipation, Marijuana use, constipation, and hemorrhoids presents with complaint of erectile dysfunction for several years. Has not been having morning erections. Can not maintain an erection during sex. Has urinary hesitancy and sometimes with urinary frequency. Some decrease in muscular strength. Does not endorse any other associated symptoms or complaints.      Outpatient Medications Prior to Visit  Medication Sig Dispense Refill  . celecoxib (CELEBREX) 200 MG capsule Take 1 capsule (200 mg total) by mouth 2 (two) times daily. (Patient not taking: Reported on 09/03/2017) 60 capsule 3  . HYDROcodone-acetaminophen (NORCO/VICODIN) 5-325 MG tablet Take 1-2 tablets by mouth every 4 (four) hours as needed for moderate pain or severe pain. 12 tablet 0  . ibuprofen (ADVIL,MOTRIN) 600 MG tablet Take 1 tablet (600 mg total) by mouth every 6 (six) hours as needed. 30 tablet 0  . methocarbamol (ROBAXIN) 500 MG tablet Take 1 tablet (500 mg total) by mouth 3 (three) times daily. (Patient not taking: Reported on 09/03/2017) 30 tablet 0   No facility-administered medications prior to visit.      ROS Review of Systems  Constitutional: Negative for chills, fever and malaise/fatigue.  Eyes: Negative for blurred vision.  Respiratory: Negative for shortness of breath.   Cardiovascular: Negative for chest pain and palpitations.  Gastrointestinal: Negative for abdominal pain and nausea.  Genitourinary: Positive for frequency and urgency. Negative for dysuria and hematuria.       Erectile dysfunction  Musculoskeletal: Negative for joint pain and myalgias.  Skin: Negative for rash.  Neurological: Negative for tingling and headaches.  Psychiatric/Behavioral: Negative for depression. The patient is not  nervous/anxious.     Objective:  BP 123/75 (BP Location: Right Arm, Patient Position: Sitting, Cuff Size: Large)   Pulse 70   Temp 98 F (36.7 C) (Oral)   Ht 5\' 5"  (1.651 m)   Wt 244 lb 6.4 oz (110.9 kg)   SpO2 94%   BMI 40.67 kg/m   BP/Weight 03/29/2018 07/28/8117 04/25/7827  Systolic BP 562 130 865  Diastolic BP 75 97 82  Wt. (Lbs) 244.4 230 237  BMI 40.67 38.27 39.44      Physical Exam  Constitutional: He is oriented to person, place, and time.  Well developed, obese, NAD, polite  HENT:  Head: Normocephalic and atraumatic.  Eyes: No scleral icterus.  Neck: Normal range of motion. Neck supple. No thyromegaly present.  Cardiovascular: Normal rate, regular rhythm and normal heart sounds.  Pulmonary/Chest: Effort normal and breath sounds normal.  Genitourinary:  Genitourinary Comments: Penis uncircumcised, without discharge. No testicular tenderness, swelling, or mass noted bilaterally. No inguinal lymphadenopathy.   Musculoskeletal: He exhibits no edema.  Neurological: He is alert and oriented to person, place, and time.  Skin: Skin is warm and dry. No rash noted. No erythema. No pallor.  Psychiatric: He has a normal mood and affect. His behavior is normal. Thought content normal.  Vitals reviewed.    Assessment & Plan:    1. Erectile dysfunction, unspecified erectile dysfunction type - TSH; Future - FSH/LH; Future - Testosterone; Future - PSA; Future - Begin sildenafil, see below.   2. Urinary hesitancy - PSA; Future  3. Need for immunization against influenza - Flu Vaccine QUAD 36+ mos IM   Meds ordered this encounter  Medications  .  sildenafil (REVATIO) 20 MG tablet    Sig: Take one or two tablets two hours before sexual activity.    Dispense:  20 tablet    Refill:  2    Order Specific Question:   Supervising Provider    Answer:   Charlott Rakes [[id number]]    Follow-up: Return in about 8 weeks (around 05/24/2018).   Clent Demark  PA

## 2018-03-29 NOTE — Patient Instructions (Signed)

## 2018-03-30 ENCOUNTER — Other Ambulatory Visit (INDEPENDENT_AMBULATORY_CARE_PROVIDER_SITE_OTHER): Payer: Medicaid Other

## 2018-03-30 DIAGNOSIS — N529 Male erectile dysfunction, unspecified: Secondary | ICD-10-CM

## 2018-03-30 DIAGNOSIS — R3911 Hesitancy of micturition: Secondary | ICD-10-CM

## 2018-03-30 NOTE — Progress Notes (Signed)
Labs collected by onsite [facility] phlebotomist. Tempestt S Roberts, CMA  

## 2018-03-31 ENCOUNTER — Telehealth (INDEPENDENT_AMBULATORY_CARE_PROVIDER_SITE_OTHER): Payer: Self-pay

## 2018-03-31 ENCOUNTER — Other Ambulatory Visit (INDEPENDENT_AMBULATORY_CARE_PROVIDER_SITE_OTHER): Payer: Self-pay | Admitting: Physician Assistant

## 2018-03-31 DIAGNOSIS — N529 Male erectile dysfunction, unspecified: Secondary | ICD-10-CM

## 2018-03-31 LAB — TSH: TSH: 2.57 u[IU]/mL (ref 0.450–4.500)

## 2018-03-31 LAB — FSH/LH
FSH: 2.2 m[IU]/mL (ref 1.5–12.4)
LH: 2.6 m[IU]/mL (ref 1.7–8.6)

## 2018-03-31 LAB — PSA: PROSTATE SPECIFIC AG, SERUM: 0.5 ng/mL (ref 0.0–4.0)

## 2018-03-31 LAB — TESTOSTERONE: Testosterone: 358 ng/dL (ref 264–916)

## 2018-03-31 NOTE — Telephone Encounter (Signed)
Patient is aware that all testing is normal. Referral made to urology to further evaluate ED. Nat Christen, CMA

## 2018-03-31 NOTE — Telephone Encounter (Signed)
-----   Message from Clent Demark, PA-C sent at 03/31/2018 12:51 PM EST ----- All testing is normal. I have made urology referral to further evaluate ED.

## 2018-05-05 MED FILL — SILDENAFIL CITRATE 20 MG TA: 20 | 30 days supply | Qty: 10 | Fill #1

## 2018-07-20 ENCOUNTER — Ambulatory Visit (INDEPENDENT_AMBULATORY_CARE_PROVIDER_SITE_OTHER): Payer: Medicaid Other | Admitting: Primary Care

## 2018-09-03 ENCOUNTER — Encounter: Payer: Self-pay | Admitting: Primary Care

## 2018-09-03 ENCOUNTER — Ambulatory Visit: Payer: Self-pay | Attending: Primary Care | Admitting: Primary Care

## 2018-09-03 ENCOUNTER — Other Ambulatory Visit: Payer: Self-pay

## 2018-09-03 DIAGNOSIS — M545 Low back pain, unspecified: Secondary | ICD-10-CM

## 2018-09-03 DIAGNOSIS — M62838 Other muscle spasm: Secondary | ICD-10-CM

## 2018-09-03 DIAGNOSIS — M1990 Unspecified osteoarthritis, unspecified site: Secondary | ICD-10-CM

## 2018-09-03 MED ORDER — CYCLOBENZAPRINE HCL 10 MG PO TABS: 10.0000 mg | ORAL_TABLET | Freq: Three times a day (TID) | ORAL | 0 refills | Status: DC | PRN

## 2018-09-03 MED FILL — CYCLOBENZAPRINE 10 MG TAB: 10 | 10 days supply | Qty: 30 | Fill #0

## 2018-09-03 NOTE — Progress Notes (Signed)
Virtual Visit via Telephone Note  I connected with Gregory Ray on 09/03/18 at  9:15 AM EDT by telephone and verified that I am speaking with the correct person using two identifiers.   I discussed the limitations, risks, security and privacy concerns of performing an evaluation and management service by telephone and the availability of in person appointments. I also discussed with the patient that there may be a patient responsible charge related to this service. The patient expressed understanding and agreed to proceed.   History of Present Illness: Gregory Ray concerns knees and back pain and muscle spasm.[name] c/o back pain esp when he is bending over and lifting something.   Past medical history of arthritis(hands, shoulders), constipation, Marijuana use, and hemorrhoids and erectile dysfunction for several years.       Observations/Objective: Review of Systems  Constitutional: Negative.   HENT: Negative.   Eyes: Negative.   Respiratory: Positive for shortness of breath.   Cardiovascular: Negative.   Gastrointestinal: Positive for constipation.  Genitourinary: Negative.   Musculoskeletal: Positive for back pain and joint pain.  Skin: Negative.   Neurological: Negative.   Endo/Heme/Allergies: Negative.   Psychiatric/Behavioral: Negative.     Assessment and Plan: Diagnoses and all orders for this visit:  Bilateral low back pain without sciatica, unspecified chronicity Unable to take NSAIDS states irritates stomach , Tylenol 500 x's 2 Q 4-6 hrs as needed for pain   Arthritis On follow up will do a RA panel   Muscle spasm Prn antispamatics   Other orders -     cyclobenzaprine (FLEXERIL) 10 MG tablet; Take 1 tablet (10 mg total) by mouth 3 (three) times daily as needed for muscle spasms.     Follow Up Instructions:    I discussed the assessment and treatment plan with the patient. The patient was provided an opportunity to ask questions and all were  answered. The patient agreed with the plan and demonstrated an understanding of the instructions.   The patient was advised to call back or seek an in-person evaluation if the symptoms worsen or if the condition fails to improve as anticipated.  I provided 18 minutes of non-face-to-face time during this encounter.   Kerin Perna, NP

## 2019-01-31 ENCOUNTER — Other Ambulatory Visit: Payer: Self-pay

## 2019-01-31 ENCOUNTER — Ambulatory Visit (INDEPENDENT_AMBULATORY_CARE_PROVIDER_SITE_OTHER): Payer: Self-pay | Admitting: Primary Care

## 2019-01-31 ENCOUNTER — Encounter (INDEPENDENT_AMBULATORY_CARE_PROVIDER_SITE_OTHER): Payer: Self-pay | Admitting: Primary Care

## 2019-01-31 VITALS — BP 135/86 | HR 73 | Temp 97.3°F | Ht 65.0 in | Wt 251.2 lb

## 2019-01-31 DIAGNOSIS — G894 Chronic pain syndrome: Secondary | ICD-10-CM

## 2019-01-31 DIAGNOSIS — Z23 Encounter for immunization: Secondary | ICD-10-CM

## 2019-01-31 DIAGNOSIS — R351 Nocturia: Secondary | ICD-10-CM

## 2019-01-31 DIAGNOSIS — R7303 Prediabetes: Secondary | ICD-10-CM

## 2019-01-31 DIAGNOSIS — R030 Elevated blood-pressure reading, without diagnosis of hypertension: Secondary | ICD-10-CM

## 2019-01-31 LAB — POCT GLYCOSYLATED HEMOGLOBIN (HGB A1C): Hemoglobin A1C: 5.4 % (ref 4.0–5.6)

## 2019-01-31 NOTE — Progress Notes (Signed)
Established Patient Office Visit  Subjective:  Patient ID: Gregory Ray, male    DOB: 1966/02/01  Age: 53 y.o. MRN: [id number]  CC:  Chief Complaint  Patient presents with  . Spasms    back across ribs  . Pain    left wrist- causing pinky finger to be numb     HPI Gregory Ray presents for chronic back pain and wrist . He denies any trauma.  Past Medical History:  Diagnosis Date  . Arthritis    hands, shoulders  . Constipation   . Marijuana user    last use 02/18/17 - 2to 3 blunts daily    Past Surgical History:  Procedure Laterality Date  . HAND SURGERY Right   . HEMORROIDECTOMY     at age 28 yrs    Family History  Problem Relation Age of Onset  . Stomach cancer Brother   . Colon cancer Neg Hx   . Colon polyps Neg Hx   . Rectal cancer Neg Hx     Social History   Socioeconomic History  . Marital status: Single    Spouse name: Not on file  . Number of children: Not on file  . Years of education: Not on file  . Highest education level: Not on file  Occupational History  . Not on file  Social Needs  . Financial resource strain: Not on file  . Food insecurity    Worry: Not on file    Inability: Not on file  . Transportation needs    Medical: Not on file    Non-medical: Not on file  Tobacco Use  . Smoking status: Current Every Day Smoker    Packs/day: 0.25    Years: 32.00    Pack years: 8.00    Types: Cigarettes  . Smokeless tobacco: Never Used  . Tobacco comment: 1 cig a day  Substance and Sexual Activity  . Alcohol use: Yes    Alcohol/week: 6.0 standard drinks    Types: 6 Cans of beer per week  . Drug use: Yes    Frequency: 3.0 times per week    Types: Marijuana    Comment: Last use 02/18/17,   2-3 blunts daily  . Sexual activity: Not on file  Lifestyle  . Physical activity    Days per week: Not on file    Minutes per session: Not on file  . Stress: Not on file  Relationships  . Social Herbalist on phone: Not on file     Gets together: Not on file    Attends religious service: Not on file    Active member of club or organization: Not on file    Attends meetings of clubs or organizations: Not on file    Relationship status: Not on file  . Intimate partner violence    Fear of current or ex partner: Not on file    Emotionally abused: Not on file    Physically abused: Not on file    Forced sexual activity: Not on file  Other Topics Concern  . Not on file  Social History Narrative  . Not on file    Outpatient Medications Prior to Visit  Medication Sig Dispense Refill  . cyclobenzaprine (FLEXERIL) 10 MG tablet Take 1 tablet (10 mg total) by mouth 3 (three) times daily as needed for muscle spasms. (Patient not taking: Reported on 01/31/2019) 30 tablet 0  . sildenafil (REVATIO) 20 MG tablet Take one or two tablets  two hours before sexual activity. (Patient not taking: Reported on 01/31/2019) 20 tablet 2   No facility-administered medications prior to visit.     Allergies  Allergen Reactions  . Ibuprofen     Irritates stomach    ROS Review of Systems  Musculoskeletal: Positive for arthralgias and back pain.       Wrist pain bilateral   All other systems reviewed and are negative.     Objective:    Physical Exam  Constitutional: He is oriented to person, place, and time. He appears well-developed and well-nourished.  HENT:  Head: Normocephalic.  Eyes: Pupils are equal, round, and reactive to light. EOM are normal.  Neck: Normal range of motion. Neck supple.  Cardiovascular: Normal rate and regular rhythm.  Pulmonary/Chest: Effort normal and breath sounds normal.  Abdominal: Soft. Bowel sounds are normal.  Musculoskeletal: Normal range of motion.  Neurological: He is alert and oriented to person, place, and time.  Skin: Skin is warm and dry.  Psychiatric: He has a normal mood and affect.    BP 135/86   Pulse 73   Temp (!) 97.3 F (36.3 C) (Temporal)   Ht '5\' 5"'$  (1.651 m)   Wt 251  lb 3.2 oz (113.9 kg)   SpO2 95%   BMI 41.80 kg/m  Wt Readings from Last 3 Encounters:  01/31/19 251 lb 3.2 oz (113.9 kg)  03/29/18 244 lb 6.4 oz (110.9 kg)  09/03/17 230 lb (104.3 kg)     Health Maintenance Due  Topic Date Due  . INFLUENZA VACCINE  11/20/2018    There are no preventive care reminders to display for this patient.  Lab Results  Component Value Date   TSH 2.570 03/30/2018   Lab Results  Component Value Date   WBC 8.8 09/03/2017   HGB 14.4 09/03/2017   HCT 42.6 09/03/2017   MCV 96.4 09/03/2017   PLT 193 09/03/2017   Lab Results  Component Value Date   NA 141 09/03/2017   K 3.8 09/03/2017   CO2 27 09/03/2017   GLUCOSE 111 (H) 09/03/2017   BUN 13 09/03/2017   CREATININE 1.30 (H) 09/03/2017   BILITOT <0.2 11/19/2016   ALKPHOS 73 11/19/2016   AST 18 11/19/2016   ALT 18 11/19/2016   PROT 6.6 11/19/2016   ALBUMIN 4.2 11/19/2016   CALCIUM 8.6 (L) 09/03/2017   ANIONGAP 8 09/03/2017   No results found for: CHOL No results found for: HDL No results found for: LDLCALC No results found for: TRIG No results found for: [facility] Lab Results  Component Value Date   HGBA1C 5.4 01/31/2019      Assessment & Plan:  Teondre was seen today for spasms and pain.  Diagnoses and all orders for this visit:  Prediabetes -     HgB A1c 5.4 not a diabetic 2 years ago 5.7 -     CBC with Differential  Need for immunization against influenza -     Flu Vaccine QUAD 36+ mos IM  Elevated blood pressure reading without diagnosis of hypertension Bp 135/86 discussed life style modification reduction of sodium in his diet and exercise 30 mins daily. Weight loss will  -     CBC with Differential -     CMP14+EGFR  Nocturia -     PSA  Morbid obesity (HCC) Morbid obesity is greater than BMI 40 indicating an excess in caloric intake or underlining conditions. This may lead to other co-morbidities ie diabetes , hypertension. Lifestyle modifications of diet  and exercise may  reduce obesity.  -     Lipid Panel  Chronic pain syndrome Lower back and bilateral wrist and hands -     Ambulatory referral to Orthopedic Surgery    No orders of the defined types were placed in this encounter.   Follow-up: Return in about 3 months (around 05/03/2019) for in person Bp check and monitor lipoma .    Kerin Perna, NP

## 2019-01-31 NOTE — Patient Instructions (Signed)
Lipoma  A lipoma is a noncancerous (benign) tumor that is made up of fat cells. This is a very common type of soft-tissue growth. Lipomas are usually found under the skin (subcutaneous). They may occur in any tissue of the body that contains fat. Common areas for lipomas to appear include the back, shoulders, buttocks, and thighs.  Lipomas grow slowly, and they are usually painless. Most lipomas do not cause problems and do not require treatment. What are the causes? The cause of this condition is not known. What increases the risk? You are more likely to develop this condition if:  You are 40-60 years old.  You have a family history of lipomas. What are the signs or symptoms? A lipoma usually appears as a small, round bump under the skin. In most cases, the lump will:  Feel soft or rubbery.  Not cause pain or other symptoms. However, if a lipoma is located in an area where it pushes on nerves, it can become painful or cause other symptoms. How is this diagnosed? A lipoma can usually be diagnosed with a physical exam. You may also have tests to confirm the diagnosis and to rule out other conditions. Tests may include:  Imaging tests, such as a CT scan or MRI.  Removal of a tissue sample to be looked at under a microscope (biopsy). How is this treated? Treatment for this condition depends on the size of the lipoma and whether it is causing any symptoms.  For small lipomas that are not causing problems, no treatment is needed.  If a lipoma is bigger or it causes problems, surgery may be done to remove the lipoma. Lipomas can also be removed to improve appearance. Most often, the procedure is done after applying a medicine that numbs the area (local anesthetic). Follow these instructions at home:  Watch your lipoma for any changes.  Keep all follow-up visits as told by your health care provider. This is important. Contact a health care provider if:  Your lipoma becomes larger or  hard.  Your lipoma becomes painful, red, or increasingly swollen. These could be signs of infection or a more serious condition. Get help right away if:  You develop tingling or numbness in an area near the lipoma. This could indicate that the lipoma is causing nerve damage. Summary  A lipoma is a noncancerous tumor that is made up of fat cells.  Most lipomas do not cause problems and do not require treatment.  If a lipoma is bigger or it causes problems, surgery may be done to remove the lipoma. This information is not intended to replace advice given to you by your health care provider. Make sure you discuss any questions you have with your health care provider. Document Released: 03/28/2002 Document Revised: 03/24/2017 Document Reviewed: 03/24/2017 Elsevier Patient Education  2020 [employer]  

## 2019-01-31 NOTE — Progress Notes (Signed)
251.2  

## 2019-02-01 LAB — CMP14+EGFR
ALT: 24 IU/L (ref 0–44)
AST: 24 IU/L (ref 0–40)
Albumin/Globulin Ratio: 1.6 (ref 1.2–2.2)
Albumin: 4 g/dL (ref 3.8–4.9)
Alkaline Phosphatase: 63 IU/L (ref 39–117)
BUN/Creatinine Ratio: 11 (ref 9–20)
BUN: 15 mg/dL (ref 6–24)
Bilirubin Total: 0.3 mg/dL (ref 0.0–1.2)
CO2: 26 mmol/L (ref 20–29)
Calcium: 9.8 mg/dL (ref 8.7–10.2)
Chloride: 101 mmol/L (ref 96–106)
Creatinine, Ser: 1.31 mg/dL — ABNORMAL HIGH (ref 0.76–1.27)
GFR calc Af Amer: 71 mL/min/{1.73_m2} (ref >59)
GFR calc non Af Amer: 61 mL/min/{1.73_m2} (ref >59)
Globulin, Total: 2.5 g/dL (ref 1.5–4.5)
Glucose: 97 mg/dL (ref 65–99)
Potassium: 4.7 mmol/L (ref 3.5–5.2)
Sodium: 140 mmol/L (ref 134–144)
Total Protein: 6.5 g/dL (ref 6.0–8.5)

## 2019-02-01 LAB — LIPID PANEL
Chol/HDL Ratio: 3.3 ratio (ref 0.0–5.0)
Cholesterol, Total: 205 mg/dL — ABNORMAL HIGH (ref 100–199)
HDL: 63 mg/dL (ref >39)
LDL Chol Calc (NIH): 115 mg/dL — ABNORMAL HIGH (ref 0–99)
Triglycerides: 155 mg/dL — ABNORMAL HIGH (ref 0–149)
VLDL Cholesterol Cal: 27 mg/dL (ref 5–40)

## 2019-02-01 LAB — CBC WITH DIFFERENTIAL/PLATELET
Basophils Absolute: 0 10*3/uL (ref 0.0–0.2)
Basos: 0 %
EOS (ABSOLUTE): 0.2 10*3/uL (ref 0.0–0.4)
Eos: 3 %
Hematocrit: 45.9 % (ref 37.5–51.0)
Hemoglobin: 15.5 g/dL (ref 13.0–17.7)
Immature Grans (Abs): 0 10*3/uL (ref 0.0–0.1)
Immature Granulocytes: 1 %
Lymphocytes Absolute: 1.3 10*3/uL (ref 0.7–3.1)
Lymphs: 27 %
MCH: 31.6 pg (ref 26.6–33.0)
MCHC: 33.8 g/dL (ref 31.5–35.7)
MCV: 94 fL (ref 79–97)
Monocytes Absolute: 0.4 10*3/uL (ref 0.1–0.9)
Monocytes: 9 %
Neutrophils Absolute: 2.9 10*3/uL (ref 1.4–7.0)
Neutrophils: 60 %
Platelets: 230 10*3/uL (ref 150–450)
RBC: 4.9 x10E6/uL (ref 4.14–5.80)
RDW: 13.9 % (ref 11.6–15.4)
WBC: 4.9 10*3/uL (ref 3.4–10.8)

## 2019-02-01 LAB — PSA: Prostate Specific Ag, Serum: 0.6 ng/mL (ref 0.0–4.0)

## 2019-02-02 ENCOUNTER — Other Ambulatory Visit (INDEPENDENT_AMBULATORY_CARE_PROVIDER_SITE_OTHER): Payer: Self-pay | Admitting: Primary Care

## 2019-02-02 MED ORDER — ATORVASTATIN CALCIUM 20 MG PO TABS: 20.0000 mg | ORAL_TABLET | Freq: Every day | ORAL | 0 refills | Status: DC

## 2019-02-02 MED FILL — ATORVASTATIN 20 MG TABLET: 20 | 30 days supply | Qty: 30 | Fill #0

## 2019-02-03 ENCOUNTER — Telehealth (INDEPENDENT_AMBULATORY_CARE_PROVIDER_SITE_OTHER): Payer: Self-pay

## 2019-02-03 NOTE — Telephone Encounter (Signed)
Patient was unavailable. His significant other was provided with the results. She will inform patient. She did state that patient has picked up atorvastatin. Asked her to remind him to take it at night after dinner and we will recheck his cholesterol in three months. Nat Christen, CMA

## 2019-02-03 NOTE — Telephone Encounter (Signed)
-----   Message from Kerin Perna, NP sent at 02/02/2019  8:39 AM EDT ----- cholesterol  Is elevated this  lead to heart attack and stroke. Decrease your fatty foods, red meat, cheese, milk and increase fiber like whole grains and veggies. You can also add a fiber supplement like Metamucil or Benefiber. Sent in a prescription for atorvastatin take after dinner repeat labs in 3 months

## 2019-02-16 ENCOUNTER — Ambulatory Visit: Payer: Medicaid Other | Admitting: Orthopaedic Surgery

## 2019-05-04 ENCOUNTER — Other Ambulatory Visit: Payer: Self-pay

## 2019-05-04 ENCOUNTER — Ambulatory Visit (INDEPENDENT_AMBULATORY_CARE_PROVIDER_SITE_OTHER): Payer: Self-pay | Admitting: Primary Care

## 2019-05-04 ENCOUNTER — Encounter (INDEPENDENT_AMBULATORY_CARE_PROVIDER_SITE_OTHER): Payer: Self-pay | Admitting: Primary Care

## 2019-05-04 VITALS — BP 122/80 | HR 64 | Temp 97.3°F | Ht 65.0 in | Wt 247.0 lb

## 2019-05-04 DIAGNOSIS — G4733 Obstructive sleep apnea (adult) (pediatric): Secondary | ICD-10-CM

## 2019-05-04 DIAGNOSIS — D170 Benign lipomatous neoplasm of skin and subcutaneous tissue of head, face and neck: Secondary | ICD-10-CM

## 2019-05-04 MED ORDER — ATORVASTATIN CALCIUM 20 MG PO TABS: 20.0000 mg | ORAL_TABLET | Freq: Every day | ORAL | 1 refills | Status: DC

## 2019-05-04 MED FILL — ATORVASTATIN CALCIUM 20 MG: 20 | 30 days supply | Qty: 30 | Fill #0

## 2019-05-04 NOTE — Progress Notes (Signed)
Established Patient Office Visit  Subjective:  Patient ID: Gregory Ray, male    DOB: Jan 19, 1966  Age: 55 y.o. MRN: [id number]  CC:  Chief Complaint  Patient presents with  . Blood Pressure Check  . Lipoma    back of head     HPI Gregory Ray presents for lipoma on back of head only hurts when touch or bother. Blood pressure is unremarkable states changed diet . He denies shortness of breath, headaches, chest pain or lower extremity edema. Complains of unable to sleep at night and snore -per patient stops breathing sometimes.  Past Medical History:  Diagnosis Date  . Arthritis    hands, shoulders  . Constipation   . Marijuana user    last use 02/18/17 - 2to 3 blunts daily    Past Surgical History:  Procedure Laterality Date  . HAND SURGERY Right   . HEMORROIDECTOMY     at age 64 yrs    Family History  Problem Relation Age of Onset  . Stomach cancer Brother   . Colon cancer Neg Hx   . Colon polyps Neg Hx   . Rectal cancer Neg Hx     Social History   Socioeconomic History  . Marital status: Single    Spouse name: Not on file  . Number of children: Not on file  . Years of education: Not on file  . Highest education level: Not on file  Occupational History  . Not on file  Tobacco Use  . Smoking status: Former Smoker    Packs/day: 0.25    Years: 32.00    Pack years: 8.00    Types: Cigarettes  . Smokeless tobacco: Never Used  . Tobacco comment: quit one year ago  Substance and Sexual Activity  . Alcohol use: Yes    Alcohol/week: 6.0 standard drinks    Types: 6 Cans of beer per week  . Drug use: Yes    Frequency: 3.0 times per week    Types: Marijuana    Comment: Last use 02/18/17,   2-3 blunts daily  . Sexual activity: Not on file  Other Topics Concern  . Not on file  Social History Narrative  . Not on file   Social Determinants of Health   Financial Resource Strain:   . Difficulty of Paying Living Expenses: Not on file  Food Insecurity:    . Worried About Charity fundraiser in the Last Year: Not on file  . Ran Out of Food in the Last Year: Not on file  Transportation Needs:   . Lack of Transportation (Medical): Not on file  . Lack of Transportation (Non-Medical): Not on file  Physical Activity:   . Days of Exercise per Week: Not on file  . Minutes of Exercise per Session: Not on file  Stress:   . Feeling of Stress : Not on file  Social Connections:   . Frequency of Communication with Friends and Family: Not on file  . Frequency of Social Gatherings with Friends and Family: Not on file  . Attends Religious Services: Not on file  . Active Member of Clubs or Organizations: Not on file  . Attends Archivist Meetings: Not on file  . Marital Status: Not on file  Intimate Partner Violence:   . Fear of Current or Ex-Partner: Not on file  . Emotionally Abused: Not on file  . Physically Abused: Not on file  . Sexually Abused: Not on file    Outpatient  Medications Prior to Visit  Medication Sig Dispense Refill  . atorvastatin (LIPITOR) 20 MG tablet Take 1 tablet (20 mg total) by mouth daily. 90 tablet 0  . cyclobenzaprine (FLEXERIL) 10 MG tablet Take 1 tablet (10 mg total) by mouth 3 (three) times daily as needed for muscle spasms. (Patient not taking: Reported on 01/31/2019) 30 tablet 0  . sildenafil (REVATIO) 20 MG tablet Take one or two tablets two hours before sexual activity. (Patient not taking: Reported on 01/31/2019) 20 tablet 2   No facility-administered medications prior to visit.    Allergies  Allergen Reactions  . Ibuprofen     Irritates stomach    ROS Review of Systems  Constitutional: Positive for fatigue.  Respiratory: Positive for shortness of breath.        Morbid obesity and snoring  Skin:       Lipoma on back of head  Neurological: Positive for headaches.  All other systems reviewed and are negative.     Objective:    Physical Exam  Constitutional: He is oriented to person,  place, and time. He appears well-developed and well-nourished.  HENT:  Head: Normocephalic.  Cardiovascular: Normal rate and regular rhythm.  Pulmonary/Chest: Effort normal and breath sounds normal.  Abdominal: Soft.  Musculoskeletal:        General: Normal range of motion.     Cervical back: Normal range of motion and neck supple.  Neurological: He is oriented to person, place, and time.  Skin:  Lipoma on head  Psychiatric: He has a normal mood and affect. His behavior is normal. Judgment and thought content normal.    BP 122/80 (BP Location: Left Arm, Patient Position: Sitting, Cuff Size: Large)   Pulse 64   Temp (!) 97.3 F (36.3 C) (Temporal)   Ht 5\' 5"  (1.651 m)   Wt 247 lb (112 kg)   SpO2 94%   BMI 41.10 kg/m  Wt Readings from Last 3 Encounters:  05/04/19 247 lb (112 kg)  01/31/19 251 lb 3.2 oz (113.9 kg)  03/29/18 244 lb 6.4 oz (110.9 kg)     There are no preventive care reminders to display for this patient.  There are no preventive care reminders to display for this patient.  Lab Results  Component Value Date   TSH 2.570 03/30/2018   Lab Results  Component Value Date   WBC 4.9 01/31/2019   HGB 15.5 01/31/2019   HCT 45.9 01/31/2019   MCV 94 01/31/2019   PLT 230 01/31/2019   Lab Results  Component Value Date   NA 140 01/31/2019   K 4.7 01/31/2019   CO2 26 01/31/2019   GLUCOSE 97 01/31/2019   BUN 15 01/31/2019   CREATININE 1.31 (H) 01/31/2019   BILITOT 0.3 01/31/2019   ALKPHOS 63 01/31/2019   AST 24 01/31/2019   ALT 24 01/31/2019   PROT 6.5 01/31/2019   ALBUMIN 4.0 01/31/2019   CALCIUM 9.8 01/31/2019   ANIONGAP 8 09/03/2017   Lab Results  Component Value Date   CHOL 205 (H) 01/31/2019   Lab Results  Component Value Date   HDL 63 01/31/2019   Lab Results  Component Value Date   LDLCALC 115 (H) 01/31/2019   Lab Results  Component Value Date   TRIG 155 (H) 01/31/2019   Lab Results  Component Value Date   CHOLHDL 3.3 01/31/2019    Lab Results  Component Value Date   HGBA1C 5.4 01/31/2019      Assessment & Plan:  Gregory Ray  was seen today for blood pressure check and lipoma.  Diagnoses and all orders for this visit:  OSA (obstructive sleep apnea) Patient presents with possible obstructive sleep apnea. Patent has a  history of symptoms of OSA . Patient generally gets 3 hours of sleep per night, and states they generally have poor quality. Snoring of  severity  present. Apneic episodes is present. Nasal obstruction not present.    Morbid obesity ([place]) Morbid Obesity is > 40  indicating an excess in caloric intake or underlining conditions. This may lead to other co-morbidities hypoxia, CVA,. Lifestyle modifications of diet and exercise may reduce obesity.   Lipoma of head Would like referral for removal. Explained he does not have insurance he can called general surgeons or dermatology for a consult. This would be out of pocket expense.    Other orders -     atorvastatin (LIPITOR) 20 MG tablet; Take 1 tablet (20 mg total) by mouth daily.   Meds ordered this encounter  Medications  . atorvastatin (LIPITOR) 20 MG tablet    Sig: Take 1 tablet (20 mg total) by mouth daily.    Dispense:  90 tablet    Refill:  1    Follow-up: Return in about 3 months (around 08/02/2019) for schedule lab only- fasting .    Kerin Perna, NP

## 2019-05-04 NOTE — Patient Instructions (Signed)

## 2019-05-05 ENCOUNTER — Ambulatory Visit: Payer: Medicaid Other | Admitting: Gastroenterology

## 2019-06-16 MED FILL — ATORVASTATIN CALCIUM 20 MG: 20 | 30 days supply | Qty: 30 | Fill #0

## 2019-08-02 ENCOUNTER — Other Ambulatory Visit: Payer: Self-pay

## 2019-08-02 ENCOUNTER — Other Ambulatory Visit (INDEPENDENT_AMBULATORY_CARE_PROVIDER_SITE_OTHER): Payer: Medicaid Other

## 2019-08-02 ENCOUNTER — Other Ambulatory Visit (INDEPENDENT_AMBULATORY_CARE_PROVIDER_SITE_OTHER): Payer: Self-pay | Admitting: Primary Care

## 2019-08-02 DIAGNOSIS — E782 Mixed hyperlipidemia: Secondary | ICD-10-CM

## 2019-08-03 LAB — CMP14+EGFR
ALT: 24 IU/L (ref 0–44)
AST: 25 IU/L (ref 0–40)
Albumin/Globulin Ratio: 1.9 (ref 1.2–2.2)
Albumin: 4.5 g/dL (ref 3.8–4.9)
Alkaline Phosphatase: 87 IU/L (ref 39–117)
BUN/Creatinine Ratio: 13 (ref 9–20)
BUN: 16 mg/dL (ref 6–24)
Bilirubin Total: 0.3 mg/dL (ref 0.0–1.2)
CO2: 21 mmol/L (ref 20–29)
Calcium: 10.1 mg/dL (ref 8.7–10.2)
Chloride: 106 mmol/L (ref 96–106)
Creatinine, Ser: 1.23 mg/dL (ref 0.76–1.27)
GFR calc Af Amer: 76 mL/min/{1.73_m2} (ref >59)
GFR calc non Af Amer: 66 mL/min/{1.73_m2} (ref >59)
Globulin, Total: 2.4 g/dL (ref 1.5–4.5)
Glucose: 121 mg/dL — ABNORMAL HIGH (ref 65–99)
Potassium: 4 mmol/L (ref 3.5–5.2)
Sodium: 143 mmol/L (ref 134–144)
Total Protein: 6.9 g/dL (ref 6.0–8.5)

## 2019-08-03 LAB — LIPID PANEL
Chol/HDL Ratio: 4.7 ratio (ref 0.0–5.0)
Cholesterol, Total: 203 mg/dL — ABNORMAL HIGH (ref 100–199)
HDL: 43 mg/dL (ref >39)
LDL Chol Calc (NIH): 118 mg/dL — ABNORMAL HIGH (ref 0–99)
Triglycerides: 242 mg/dL — ABNORMAL HIGH (ref 0–149)
VLDL Cholesterol Cal: 42 mg/dL — ABNORMAL HIGH (ref 5–40)

## 2019-08-05 ENCOUNTER — Other Ambulatory Visit (INDEPENDENT_AMBULATORY_CARE_PROVIDER_SITE_OTHER): Payer: Self-pay | Admitting: Primary Care

## 2019-08-05 ENCOUNTER — Telehealth (INDEPENDENT_AMBULATORY_CARE_PROVIDER_SITE_OTHER): Payer: Self-pay

## 2019-08-05 MED ORDER — ATORVASTATIN CALCIUM 40 MG PO TABS: 40.0000 mg | ORAL_TABLET | Freq: Every day | ORAL | 1 refills | Status: AC

## 2019-08-05 MED FILL — ATORVASTATIN CALCIUM 40 MG: 40 | 30 days supply | Qty: 30 | Fill #0

## 2019-08-05 NOTE — Telephone Encounter (Signed)
Patient is aware of elevated cholesterol which can lead to stroke and heart attack. Atorvastatin increased to 40 mg. Advised patient to decrease fatty food, red meat, cheese and milk, increase whole grains and veggies. He verbalized understanding. Nat Christen, CMA

## 2019-08-05 NOTE — Telephone Encounter (Signed)
-----   Message from Kerin Perna, NP sent at 08/05/2019  8:47 AM EDT ----- Your cholesterol panel is elevated this can increase your risk for a stroke ofr a heart attack decrease your fatty foods, red meat, cheese, milk and increase fiber like whole grains and veggies. Increased atorvastatin 40mg  take at bed time

## 2020-10-29 ENCOUNTER — Ambulatory Visit: Payer: Medicaid Other | Admitting: Internal Medicine

## 2022-02-19 ENCOUNTER — Encounter: Payer: Self-pay | Admitting: Gastroenterology

## 2023-02-04 ENCOUNTER — Ambulatory Visit: Payer: Self-pay | Admitting: Surgery

## 2023-02-04 NOTE — H&P (Signed)
Subjective   Chief Complaint: New Consultation ( Scalp lesion)       History of Present Illness: Gregory Ray is a 57 y.o. male who is seen today as an office consultation at the request of Dr. Julio Sicks for evaluation of New Consultation ( Scalp lesion) .   This is a 57 year old male who is a smoker who presents with a five year history of an enlarging mass on his posterior scalp.  This has become tender.  No episodes of infections.  Due to the enlarging size and discomfort, he would like to have it removed.   No blood thinners   Review of Systems: A complete review of systems was obtained from the patient.  I have reviewed this information and discussed as appropriate with the patient.  See HPI as well for other ROS.   Review of Systems  Constitutional: Negative.   HENT: Negative.    Eyes: Negative.   Respiratory: Negative.    Cardiovascular: Negative.   Gastrointestinal: Negative.   Genitourinary: Negative.   Musculoskeletal: Negative.   Skin: Negative.   Neurological: Negative.   Endo/Heme/Allergies: Negative.   Psychiatric/Behavioral: Negative.          Medical History: Past Medical History Past Medical History: Diagnosis Date  Arthritis         Problem List There is no problem list on file for this patient.     Past Surgical History History reviewed. No pertinent surgical history.     Allergies Allergies Allergen Reactions  Ibuprofen Unknown     Irritates stomach      Medications Ordered Prior to Encounter No current outpatient medications on file prior to visit.    No current facility-administered medications on file prior to visit.      Family History Family History Problem Relation Age of Onset  Stomach cancer Brother        Tobacco Use History Social History    Tobacco Use Smoking Status Every Day  Types: Cigarettes Smokeless Tobacco Not on file      Social History Social History    Socioeconomic History  Marital  status: Single Tobacco Use  Smoking status: Every Day     Types: Cigarettes Vaping Use  Vaping status: Unknown Substance and Sexual Activity  Alcohol use: Yes  Drug use: Never      Objective:     Vitals:   02/04/23 1517 BP: 120/81 Pulse: 74 Temp: 36.7 C (98.1 F) SpO2: 97% Weight: (!) 112.7 kg (248 lb 6.4 oz) Height: 167.6 cm (5\' 6" ) PainSc:   5   Body mass index is 40.09 kg/m.   Physical Exam    Constitutional:  WDWN in NAD, conversant, no obvious deformities; lying in bed comfortably Eyes:  Pupils equal, round; sclera anicteric; moist conjunctiva; no lid lag HENT:  Oral mucosa moist; good dentition  Posterior scalp - upper midline - well-demarcated 3 cm protruding subcutaneous mass; soft, no drainage Neck:  No masses palpated, trachea midline; no thyromegaly Lungs:  CTA bilaterally; normal respiratory effort CV:  Regular rate and rhythm; no murmurs; extremities well-perfused with no edema Abd:  +bowel sounds, soft, non-tender, no palpable organomegaly; no palpable hernias Musc:  Unable to assess gait; no apparent clubbing or cyanosis in extremities Lymphatic:  No palpable cervical or axillary lymphadenopathy Skin:  Warm, dry; no sign of jaundice Psychiatric - alert and oriented x 4; calm mood and affect       Assessment and Plan: Diagnoses and all orders for this visit:  Lipoma of scalp Comments: 3 cm posterior subcutaneous     Recommend excision of subcutaneous lipoma - posterior scalp ( 3 cm).  The surgical procedure has been discussed with the patient.  Potential risks, benefits, alternative treatments, and expected outcomes have been explained.  All of the patient's questions at this time have been answered.  The likelihood of reaching the patient's treatment goal is good.  The patient understands the proposed surgical procedure and wishes to proceed.   I also counseled the patient on smoking cessation.     Lissa Morales, MD  02/04/2023 4:22  PM

## 2023-03-24 ENCOUNTER — Other Ambulatory Visit: Payer: Self-pay | Admitting: Surgery

## 2023-03-27 LAB — SURGICAL PATHOLOGY
